# Patient Record
Sex: Female | Born: 1955
Health system: Southern US, Community
[De-identification: ages and names within clinical notes are randomized; demographics above are authoritative.]

## PROBLEM LIST (undated history)

## (undated) DIAGNOSIS — M1990 Unspecified osteoarthritis, unspecified site: Secondary | ICD-10-CM

## (undated) DIAGNOSIS — T7840XA Allergy, unspecified, initial encounter: Secondary | ICD-10-CM

## (undated) DIAGNOSIS — C801 Malignant (primary) neoplasm, unspecified: Secondary | ICD-10-CM

## (undated) DIAGNOSIS — E669 Obesity, unspecified: Secondary | ICD-10-CM

## (undated) DIAGNOSIS — I10 Essential (primary) hypertension: Secondary | ICD-10-CM

## (undated) HISTORY — DX: Essential (primary) hypertension: I10

## (undated) HISTORY — DX: Obesity, unspecified: E66.9

## (undated) HISTORY — PX: TOTAL ABDOMINAL HYSTERECTOMY W/ BILATERAL SALPINGOOPHORECTOMY: SHX83

## (undated) HISTORY — DX: Allergy, unspecified, initial encounter: T78.40XA

---

## 2005-05-10 ENCOUNTER — Encounter: Payer: Self-pay | Admitting: Family Medicine

## 2006-03-17 ENCOUNTER — Encounter: Payer: Self-pay | Admitting: Family Medicine

## 2006-03-17 ENCOUNTER — Ambulatory Visit: Payer: Self-pay | Admitting: Family Medicine

## 2006-03-17 DIAGNOSIS — I10 Essential (primary) hypertension: Secondary | ICD-10-CM | POA: Insufficient documentation

## 2006-03-17 DIAGNOSIS — R05 Cough: Secondary | ICD-10-CM

## 2006-03-17 DIAGNOSIS — R059 Cough, unspecified: Secondary | ICD-10-CM | POA: Insufficient documentation

## 2006-06-06 ENCOUNTER — Telehealth (INDEPENDENT_AMBULATORY_CARE_PROVIDER_SITE_OTHER): Payer: Self-pay | Admitting: *Deleted

## 2006-06-22 ENCOUNTER — Ambulatory Visit (HOSPITAL_COMMUNITY): Admission: RE | Admit: 2006-06-22 | Discharge: 2006-06-22 | Payer: Self-pay | Admitting: Family Medicine

## 2006-06-22 LAB — HM MAMMOGRAPHY

## 2006-06-24 ENCOUNTER — Encounter: Payer: Self-pay | Admitting: Family Medicine

## 2006-07-04 ENCOUNTER — Telehealth: Payer: Self-pay | Admitting: Family Medicine

## 2007-11-28 ENCOUNTER — Ambulatory Visit: Payer: Self-pay | Admitting: Family Medicine

## 2007-11-28 DIAGNOSIS — G2581 Restless legs syndrome: Secondary | ICD-10-CM | POA: Insufficient documentation

## 2007-11-28 LAB — CONVERTED CEMR LAB
BUN: 17 mg/dL (ref 6–23)
CO2: 26 meq/L (ref 19–32)
Chloride: 101 meq/L (ref 96–112)
Cholesterol: 183 mg/dL (ref 0–200)
Ferritin: 51 ng/mL (ref 10–291)
MCV: 84.4 fL (ref 78.0–100.0)
Platelets: 219 10*3/uL (ref 150–400)
Potassium: 3.7 meq/L (ref 3.5–5.3)
RBC: 4.95 M/uL (ref 3.87–5.11)
Sodium: 140 meq/L (ref 135–145)
Total Bilirubin: 0.6 mg/dL (ref 0.3–1.2)
Total Protein: 7.6 g/dL (ref 6.0–8.3)
Triglycerides: 129 mg/dL (ref <150)
VLDL: 26 mg/dL (ref 0–40)
WBC: 7.4 10*3/uL (ref 4.0–10.5)

## 2007-11-29 ENCOUNTER — Encounter: Payer: Self-pay | Admitting: Family Medicine

## 2007-12-13 ENCOUNTER — Encounter: Payer: Self-pay | Admitting: Family Medicine

## 2007-12-13 ENCOUNTER — Other Ambulatory Visit: Admission: RE | Admit: 2007-12-13 | Discharge: 2007-12-13 | Payer: Self-pay | Admitting: Family Medicine

## 2007-12-13 ENCOUNTER — Ambulatory Visit: Payer: Self-pay | Admitting: Family Medicine

## 2008-01-18 ENCOUNTER — Encounter: Admission: RE | Admit: 2008-01-18 | Discharge: 2008-01-18 | Payer: Self-pay | Admitting: Family Medicine

## 2008-01-18 ENCOUNTER — Ambulatory Visit: Payer: Self-pay | Admitting: Family Medicine

## 2008-01-18 DIAGNOSIS — M549 Dorsalgia, unspecified: Secondary | ICD-10-CM | POA: Insufficient documentation

## 2008-05-31 ENCOUNTER — Encounter: Admission: RE | Admit: 2008-05-31 | Discharge: 2008-05-31 | Payer: Self-pay | Admitting: Family Medicine

## 2008-05-31 ENCOUNTER — Ambulatory Visit: Payer: Self-pay | Admitting: Family Medicine

## 2008-05-31 DIAGNOSIS — M25569 Pain in unspecified knee: Secondary | ICD-10-CM | POA: Insufficient documentation

## 2008-10-23 ENCOUNTER — Telehealth: Payer: Self-pay | Admitting: Family Medicine

## 2008-10-28 ENCOUNTER — Telehealth: Payer: Self-pay | Admitting: Family Medicine

## 2009-12-15 ENCOUNTER — Ambulatory Visit: Payer: Self-pay | Admitting: Family Medicine

## 2009-12-15 DIAGNOSIS — D239 Other benign neoplasm of skin, unspecified: Secondary | ICD-10-CM | POA: Insufficient documentation

## 2009-12-16 LAB — CONVERTED CEMR LAB
BUN: 16 mg/dL (ref 6–23)
Cholesterol, target level: 200 mg/dL
Glucose, Bld: 90 mg/dL (ref 70–99)
HDL: 54 mg/dL (ref >39)
LDL Cholesterol: 110 mg/dL — ABNORMAL HIGH (ref 0–99)
LDL Goal: 160 mg/dL
Total Bilirubin: 0.9 mg/dL (ref 0.3–1.2)
Total CHOL/HDL Ratio: 3.5
VLDL: 27 mg/dL (ref 0–40)
Vit D, 25-Hydroxy: 38 ng/mL (ref 30–89)

## 2010-06-09 NOTE — Assessment & Plan Note (Signed)
Summary: HTN, lesion on arm   Vital Signs:  Patient profile:   55 year old female Height:      63 inches Weight:      203 pounds BMI:     36.09 Pulse rate:   69 / minute BP sitting:   117 / 75  (left arm) Cuff size:   large  Vitals Entered By: Kathlene November (December 15, 2009 8:42 AM) CC: follow-up BP   Primary Care Provider:  Nani Gasser MD  CC:  follow-up BP.  History of Present Illness: Has lost 24 lbs.   Current Medications (verified): 1)  Hyzaar 50-12.5 Mg Tabs (Losartan Potassium-Hctz) .... Take 1 Tablet By Mouth Once A Day  Allergies (verified): 1)  ! * (Ace Inhibitors)  Comments:  Nurse/Medical Assistant: The patient's medications and allergies were reviewed with the patient and were updated in the Medication and Allergy Lists. Kathlene November (December 15, 2009 8:43 AM)  Physical Exam  General:  Well-developed,well-nourished,in no acute distress; alert,appropriate and cooperative throughout examination Head:  Normocephalic and atraumatic without obvious abnormalities. No apparent alopecia or balding. Lungs:  Normal respiratory effort, chest expands symmetrically. Lungs are clear to auscultation, no crackles or wheezes. Heart:  Normal rate and regular rhythm. S1 and S2 normal without gallop, murmur, click, rub or other extra sounds. Skin:  Hyperpigmened ciruclar lesion on the right upper out arm with a partial halo effect.   Psych:  Cognition and judgment appear intact. Alert and cooperative with normal attention span and concentration. No apparent delusions, illusions, hallucinations   Impression & Recommendations:  Problem # 1:  HYPERTENSION, BENIGN ESSENTIAL (ICD-401.1) Congratulated her on her weight loss. doing well overall. Due for screening labs.  Exam is normal.  Her updated medication list for this problem includes:    Hyzaar 50-12.5 Mg Tabs (Losartan potassium-hctz) .[name]... Take 1 tablet by mouth once a day  Orders: T-Comprehensive Metabolic Panel  ([id number] T-Lipid Profile [phone]) T-TSH [phone]) T-Vitamin D (25-Hydroxy) [phone])  Problem # 2:  NEVUS, ATYPICAL (ICD-216.9) Had a question about a lesion on her right upper outer arm.  Slighly atypical because has a halo around part of it.  Discussed recommend schedule a bx for further dx.  Hopefully just a resolving nevus but some concerning for atypia.   Problem # 3:  SCREENING FOR MLIG NEOP, BREAST, NOS (ICD-V76.10) Due for mammogram.  Orders: T-Mammography Bilateral Screening ([id number])  Complete Medication List: 1)  Hyzaar 50-12.5 Mg Tabs (Losartan potassium-hctz) .... Take 1 tablet by mouth once a day  Other Orders: T-Comprehensive Metabolic Panel ([id number])  Patient Instructions: 1)  Please schedule a follow-up appointment in 6 months for blood pressure . 2)  If feels like your pressure is too low.    Flex Sig Next Due:  Not Indicated Hemoccult Next Due:  Not Indicated

## 2010-08-31 ENCOUNTER — Other Ambulatory Visit: Payer: Self-pay | Admitting: Family Medicine

## 2010-10-02 ENCOUNTER — Other Ambulatory Visit: Payer: Self-pay | Admitting: *Deleted

## 2010-10-02 MED ORDER — LOSARTAN POTASSIUM-HCTZ 50-12.5 MG PO TABS: 1.0000 | ORAL_TABLET | Freq: Every day | ORAL | Status: DC

## 2010-11-02 ENCOUNTER — Other Ambulatory Visit: Payer: Self-pay | Admitting: Family Medicine

## 2010-11-02 MED ORDER — LOSARTAN POTASSIUM-HCTZ 50-12.5 MG PO TABS: 1.0000 | ORAL_TABLET | Freq: Every day | ORAL | Status: DC

## 2010-11-21 ENCOUNTER — Encounter: Payer: Self-pay | Admitting: Family Medicine

## 2010-11-25 ENCOUNTER — Other Ambulatory Visit (HOSPITAL_COMMUNITY)
Admission: RE | Admit: 2010-11-25 | Discharge: 2010-11-25 | Disposition: A | Discharge status: Home / Self Care | Payer: BC Managed Care – PPO | Source: Ambulatory Visit | Attending: Family Medicine | Admitting: Family Medicine

## 2010-11-25 ENCOUNTER — Encounter: Payer: Self-pay | Admitting: Family Medicine

## 2010-11-25 ENCOUNTER — Ambulatory Visit (INDEPENDENT_AMBULATORY_CARE_PROVIDER_SITE_OTHER): Payer: BC Managed Care – PPO | Admitting: Family Medicine

## 2010-11-25 VITALS — BP 151/71 | HR 78 | Ht 63.0 in | Wt 218.0 lb

## 2010-11-25 DIAGNOSIS — Z1231 Encounter for screening mammogram for malignant neoplasm of breast: Secondary | ICD-10-CM

## 2010-11-25 DIAGNOSIS — Z1159 Encounter for screening for other viral diseases: Secondary | ICD-10-CM | POA: Insufficient documentation

## 2010-11-25 DIAGNOSIS — Z01419 Encounter for gynecological examination (general) (routine) without abnormal findings: Secondary | ICD-10-CM | POA: Insufficient documentation

## 2010-11-25 LAB — LIPID PANEL
Cholesterol: 199 mg/dL (ref 0–200)
HDL: 60 mg/dL (ref >39)
Total CHOL/HDL Ratio: 3.3 Ratio
Triglycerides: 118 mg/dL (ref <150)
VLDL: 24 mg/dL (ref 0–40)

## 2010-11-25 LAB — TSH: TSH: 4.494 u[IU]/mL (ref 0.350–4.500)

## 2010-11-25 MED ORDER — LOSARTAN POTASSIUM-HCTZ 100-12.5 MG PO TABS: 1.0000 | ORAL_TABLET | Freq: Every day | ORAL | Status: DC

## 2010-11-25 NOTE — Progress Notes (Signed)
  Subjective:     Emily Mcdonald is a 55 y.o. female and is here for a comprehensive physical exam. The patient reports no problems.Says she has gained about 15 lbs. Hasn't been working our recently.   History   Social History  . Marital Status: Married    Spouse Name: N/A    Number of Children: N/A  . 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 Smoking status: Never Smoker   . Smokeless tobacco: Not on file  . Alcohol Use: No  . Drug Use: No  . Sexually Active: Not Currently   Other Topics Concern  . Not on file   Social History Narrative   1-2 caffeine drinks   A day.  No regular exercise.     Health Maintenance  Topic Date Due  . Pap Smear  02/21/1973  . Mammogram  06/22/2008  . Influenza Vaccine  02/08/2011  . Tetanus/tdap  05/11/2015  . Colonoscopy  10/09/2015    The following portions of the patient's history were reviewed and updated as appropriate: allergies, current medications, past family history, past medical history, past social history and past surgical history.  Review of Systems A comprehensive review of systems was negative.   Objective:    BP 151/71  Pulse 78  Ht 5\' 3"  (1.6 m)  Wt 218 lb (98.884 kg)  BMI 38.62 kg/m2  SpO2 96%  LMP 10/16/2010 General appearance: alert, cooperative and appears stated age Head: Normocephalic, without obvious abnormality, atraumatic Eyes: conjunctiva clear, EOMi PEERLA Ears: normal TM's and external ear canals both ears Nose: Nares normal. Septum midline. Mucosa normal. No drainage or sinus tenderness. Throat: lips, mucosa, and tongue normal; teeth and gums normal Neck: no adenopathy, no carotid bruit, supple, symmetrical, trachea midline and thyroid not enlarged, symmetric, no tenderness/mass/nodules Back: symmetric, no curvature. ROM normal. No CVA tenderness. Lungs: clear to auscultation bilaterally Breasts: normal appearance, no masses  or tenderness Heart: regular rate and rhythm, S1, S2 normal, no murmur, click, rub or gallop Abdomen: soft, non-tender; bowel sounds normal; no masses,  no organomegaly Pelvic: cervix normal in appearance, external genitalia normal, no cervical motion tenderness, rectovaginal septum normal, uterus normal size, shape, and consistency and vagina normal without discharge Extremities: extremities normal, atraumatic, no cyanosis or edema Pulses: 2+ and symmetric Skin: Skin color, texture, turgor normal. No rashes or lesions Lymph nodes: Cervical, supraclavicular, and axillary nodes normal. Neurologic: Grossly normal    Assessment:    Healthy female exam.   Plan:     See After Visit Summary for Counseling Recommendations  Start a regular exercise program and make sure you are eating a healthy diet Try to eat 4 servings of dairy a day or take a calcium supplement (500mg  twice a day). Your vaccines are up to date.  Work on weight loss HTN- Not well controlled. Inc med and f/u in 4-6 weeks to recheck Given lab slip for screening labs.

## 2010-11-25 NOTE — Patient Instructions (Signed)
Start a regular exercise program and make sure you are eating a healthy diet Try to eat 4 servings of dairy a day or take a calcium supplement (500mg twice a day). Your vaccines are up to date.   

## 2010-11-25 NOTE — Progress Notes (Signed)
Addended by: Ellsworth Lennox on: 11/25/2010 09:04 AM   Modules accepted: Orders

## 2010-11-26 ENCOUNTER — Telehealth: Payer: Self-pay | Admitting: Family Medicine

## 2010-11-26 LAB — COMPLETE METABOLIC PANEL WITH GFR
ALT: 14 U/L (ref 0–35)
AST: 23 U/L (ref 0–37)
Albumin: 4.6 g/dL (ref 3.5–5.2)
BUN: 19 mg/dL (ref 6–23)
CO2: 27 mEq/L (ref 19–32)
Creat: 0.82 mg/dL (ref 0.50–1.10)
GFR, Est African American: >60 mL/min (ref >60)
GFR, Est Non African American: >60 mL/min (ref >60)
Glucose, Bld: 91 mg/dL (ref 70–99)
Sodium: 140 mEq/L (ref 135–145)
Total Bilirubin: 0.6 mg/dL (ref 0.3–1.2)

## 2010-11-26 NOTE — Telephone Encounter (Signed)
Advised pt of results and rec. 

## 2010-11-26 NOTE — Telephone Encounter (Signed)
Call patient: CMP is normal. Cholesterol slightly elevated. Make sure eating healthy low-fat diet and getting regular exercise. Recheck in one year. Thyroid is normal range.

## 2010-12-01 ENCOUNTER — Ambulatory Visit
Admission: RE | Admit: 2010-12-01 | Discharge: 2010-12-01 | Disposition: A | Discharge status: Home / Self Care | Payer: BC Managed Care – PPO | Source: Ambulatory Visit | Attending: Family Medicine | Admitting: Family Medicine

## 2010-12-01 ENCOUNTER — Ambulatory Visit: Payer: BC Managed Care – PPO

## 2010-12-01 DIAGNOSIS — Z1231 Encounter for screening mammogram for malignant neoplasm of breast: Secondary | ICD-10-CM

## 2010-12-03 ENCOUNTER — Telehealth: Payer: Self-pay | Admitting: Family Medicine

## 2010-12-03 NOTE — Telephone Encounter (Signed)
Please call patient. Normal mammogram.  Repeat in 1 year.  

## 2010-12-04 NOTE — Telephone Encounter (Signed)
Pt.notified

## 2010-12-07 ENCOUNTER — Telehealth: Payer: Self-pay | Admitting: Family Medicine

## 2010-12-07 NOTE — Telephone Encounter (Signed)
Left message on pt vm with resutls

## 2010-12-07 NOTE — Telephone Encounter (Signed)
Call pt: Pap is normal. Repeat in 2 years.  

## 2010-12-24 ENCOUNTER — Encounter: Payer: Self-pay | Admitting: Family Medicine

## 2010-12-24 ENCOUNTER — Ambulatory Visit (INDEPENDENT_AMBULATORY_CARE_PROVIDER_SITE_OTHER): Payer: BC Managed Care – PPO | Admitting: Family Medicine

## 2010-12-24 DIAGNOSIS — Z23 Encounter for immunization: Secondary | ICD-10-CM

## 2010-12-24 DIAGNOSIS — I10 Essential (primary) hypertension: Secondary | ICD-10-CM

## 2010-12-24 DIAGNOSIS — N898 Other specified noninflammatory disorders of vagina: Secondary | ICD-10-CM

## 2010-12-24 DIAGNOSIS — N924 Excessive bleeding in the premenopausal period: Secondary | ICD-10-CM

## 2010-12-24 DIAGNOSIS — N920 Excessive and frequent menstruation with regular cycle: Secondary | ICD-10-CM

## 2010-12-24 NOTE — Assessment & Plan Note (Signed)
Inc in med is working well. At goal. F/U in 6 months.

## 2010-12-24 NOTE — Patient Instructions (Signed)
We will call you with your lab results 

## 2010-12-24 NOTE — Progress Notes (Signed)
  Subjective:    Patient ID: Emily Mcdonald, female    DOB: [birth year], 55 y.o.   MRN: [id number]  Hypertension This is a chronic problem. The current episode started more than 1 year ago. The problem is controlled. Pertinent negatives include no blurred vision, chest pain, headaches, peripheral edema or shortness of breath. There are no associated agents to hypertension. Past treatments include angiotensin blockers and diuretics. The current treatment provides moderate improvement. There are no compliance problems.    She had stopped her period for almost  Year. Then had a really heavy periods. Then nothing for almost 6 months and now has been spotting again.    Review of Systems  Eyes: Negative for blurred vision.  Respiratory: Negative for shortness of breath.   Cardiovascular: Negative for chest pain.  Neurological: Negative for headaches.       Objective:   Physical Exam  Constitutional: She is oriented to person, place, and time. She appears well-developed and well-nourished.  HENT:  Head: Normocephalic and atraumatic.  Cardiovascular: Normal rate, regular rhythm and normal heart sounds.   Pulmonary/Chest: Effort normal and breath sounds normal.  Neurological: She is alert and oriented to person, place, and time.  Skin: Skin is warm and dry.  Psychiatric: She has a normal mood and affect. Her behavior is normal.          Assessment & Plan:  Perimenopausal bleeding - Will check hormone levels. If looks post menopausal then will need referrral for endometrial bx to rule out endometrial hyperplasia.  note she had a normal Pap smear in July and was negative for HPV.

## 2010-12-25 ENCOUNTER — Telehealth: Payer: Self-pay | Admitting: Family Medicine

## 2010-12-25 DIAGNOSIS — N950 Postmenopausal bleeding: Secondary | ICD-10-CM

## 2010-12-25 LAB — PROGESTERONE: Progesterone: <0.2 ng/mL

## 2010-12-25 NOTE — Telephone Encounter (Signed)
Call pt: By her labs she looks postmenopausal so I want her to see gyn to see if they think she might need an endometrial bx.

## 2010-12-25 NOTE — Telephone Encounter (Signed)
Pt notified of results via VM. Instructed pt we would call with referral date and time. KJ LPN

## 2011-02-02 ENCOUNTER — Ambulatory Visit (INDEPENDENT_AMBULATORY_CARE_PROVIDER_SITE_OTHER): Payer: BC Managed Care – PPO | Admitting: Obstetrics & Gynecology

## 2011-02-02 ENCOUNTER — Encounter: Payer: Self-pay | Admitting: Obstetrics & Gynecology

## 2011-02-02 VITALS — BP 128/80 | HR 83 | Temp 98.5°F | Resp 16 | Ht 63.0 in | Wt 222.0 lb

## 2011-02-02 DIAGNOSIS — N950 Postmenopausal bleeding: Secondary | ICD-10-CM | POA: Insufficient documentation

## 2011-02-02 NOTE — Patient Instructions (Signed)
Postmenopausal Bleeding Postmenopausal bleeding is when you have bleeding from the uterus 12 months after you stopped having menstrual periods. It could also be if you are still having menstrual periods and you are 55 years old or older. HOME CARE  Get yearly physical exams. This includes a pelvic exam and Pap test.   Call your doctor if you are in the menopause and have vaginal bleeding after sex.   Stop taking hormones. Talk to your doctor about this.  GET HELP IF:  You are passing clumps of blood (blood clots).   You have more bleeding than when you were having periods.   You are having a lot of pain with the bleeding.   You have a temperature by mouth above 102 F (38.9 C).   You start to have belly (abdominal) pain.   You pass out.  GET HELP RIGHT AWAY IF:  The bleeding lasts for more than one week.   You have bleeding and need more than one pad an hour.   You have signs of infection. These signs include fever, chills, headache, dizziness and muscle aches.   You have a temperature by mouth above 102 F (38.9 C), not controlled by medicine.  Easy-to-Read style based on content from [facility], [place], [place] Document Released: 02/03/2008 Document Re-Released: 04/14/2009 Exit[employer] Patient Information 2011 [employer]. 

## 2011-02-02 NOTE — Progress Notes (Signed)
  Subjective:    Patient ID: Charlott Rakes, female    DOB: 1955/06/30, 55 y.o.   MRN: [id number]  HPI Pt is a 55 yo G2P2 female with menopause starting approximately 3 years ago. Patient had gone in one year without a period. She had heavy bleeding in June. She has also had a . She has had FSH which showed her to be menopausal.  The patient denies any gyn problems--No std, fibroids, ovarian cysts, abnml paps.    Review of Systems  Constitutional: Negative.   Respiratory: Negative.   Cardiovascular: Negative.   Gastrointestinal: Negative.   Genitourinary: Negative.   Psychiatric/Behavioral: Negative.        Objective:   Physical Exam  Constitutional: She is oriented to person, place, and time. She appears well-developed and well-nourished. No distress.  HENT:  Head: Normocephalic and atraumatic.  Eyes: Conjunctivae are normal.  Pulmonary/Chest: Effort normal.  Abdominal: Soft. She exhibits no distension and no mass. There is no tenderness.  Genitourinary:       Atrophic vagina.  Nml cervix, uterus and adnexa were nontender but exam limited by body habitus.  Neurological: She is alert and oriented to person, place, and time.  Skin: Skin is warm and dry.  Psychiatric: She has a normal mood and affect.          Assessment & Plan:  Assessment. Patient is a 55 year old G2, P2, female with postmenopausal bleeding. Plan. 1. Exam normal. Bleeding bleeding to be coming from the uterus. Will order a transvaginal ultrasound to evaluate endometrial stripe. 2. Endometrial stripe is 4 mm biopsy is not warranted.If lLining is greater than 4, millimeters we'll proceed with endometrial biopsy. 3.  RTC 3 weeks.

## 2011-02-03 ENCOUNTER — Other Ambulatory Visit: Payer: Self-pay | Admitting: Obstetrics & Gynecology

## 2011-02-03 DIAGNOSIS — N950 Postmenopausal bleeding: Secondary | ICD-10-CM

## 2011-02-09 ENCOUNTER — Ambulatory Visit
Admission: RE | Admit: 2011-02-09 | Discharge: 2011-02-09 | Disposition: A | Discharge status: Home / Self Care | Payer: BC Managed Care – PPO | Source: Ambulatory Visit | Attending: Obstetrics & Gynecology | Admitting: Obstetrics & Gynecology

## 2011-02-09 DIAGNOSIS — N950 Postmenopausal bleeding: Secondary | ICD-10-CM

## 2011-02-22 ENCOUNTER — Other Ambulatory Visit: Payer: Self-pay | Admitting: Family Medicine

## 2011-03-02 ENCOUNTER — Encounter: Payer: Self-pay | Admitting: Obstetrics & Gynecology

## 2011-03-02 ENCOUNTER — Ambulatory Visit (INDEPENDENT_AMBULATORY_CARE_PROVIDER_SITE_OTHER): Payer: BC Managed Care – PPO | Admitting: Obstetrics & Gynecology

## 2011-03-02 VITALS — BP 127/75 | HR 81 | Temp 98.5°F | Resp 16 | Ht 63.0 in | Wt 223.0 lb

## 2011-03-02 DIAGNOSIS — N950 Postmenopausal bleeding: Secondary | ICD-10-CM | POA: Insufficient documentation

## 2011-03-02 NOTE — Progress Notes (Signed)
  Subjective:    Patient ID: Emily Mcdonald, female    DOB: [birth year], 55 y.o.   MRN: [id number]  HPI  Pt presents for follow up.  Pt having postmenopausal bleeding.  Still bleeding now.  US shows 16 mm lining and questionable polyp.  Pt needs Endometrial Bx and wants to proceed today  Review of Systems  Constitutional: Negative.   Respiratory: Negative.   Cardiovascular: Negative.   Gastrointestinal: Negative.   Genitourinary: Positive for vaginal bleeding.  Musculoskeletal: Negative.   Psychiatric/Behavioral: Negative.        Objective:   Physical Exam  Constitutional: She is oriented to person, place, and time. She appears well-developed and well-nourished. No distress.  HENT:  Head: Normocephalic and atraumatic.  Eyes: Conjunctivae are normal.  Pulmonary/Chest: Effort normal.  Abdominal: Soft.  Genitourinary: Vagina normal and uterus normal.  Neurological: She is alert and oriented to person, place, and time.  Skin: Skin is warm and dry.  Psychiatric: She has a normal mood and affect.          Assessment & Plan:  Patient given informed consent, signed copy in the chart, time out was performed. Appropriate time out taken. . The patient was placed in the lithotomy position and the cervix brought into view with sterile speculum.  Portio of cervix cleansed x 2 with betadine swabs.  A tenaculum was placed in the anterior lip of the cervix.  The uterus was sounded for depth of 7cm. A pipelle was introduced to into the uterus, suction created,  and an endometrial sample was obtained. All equipment was removed and accounted for.  The patient tolerated the procedure well.    Patient given post procedure instructions. The patient will return in 2 weeks for results.

## 2011-03-02 NOTE — Patient Instructions (Signed)
Endometrial Biopsy This is a test in which a tissue sample (a biopsy) is taken from inside the uterus (womb). It is then looked at by a specialist under a microscope to see if the tissue is normal or abnormal. The endometrium is the lining of the uterus. This test helps determine where you are in your menstrual cycle and how hormone levels are affecting the lining of the uterus. Another use for this test is to diagnose endometrial cancer, tuberculosis, polyps, or inflammatory conditions and to evaluate uterine bleeding. PREPARATION FOR TEST No preparation or fasting is necessary. NORMAL FINDINGS No pathologic conditions. Presence of "secretory-type" endometrium 3 to 5 days before to normal menstruation. Ranges for normal findings may vary among different laboratories and hospitals. You should always check with your doctor after having lab work or other tests done to discuss the meaning of your test results and whether your values are considered within normal limits. MEANING OF TEST  Your caregiver will go over the test results with you and discuss the importance and meaning of your results, as well as treatment options and the need for additional tests if necessary. OBTAINING THE TEST RESULTS It is your responsibility to obtain your test results. Ask the lab or department performing the test when and how you will get your results. Document Released: 08/27/2004 Document Revised: 01/06/2011 Document Reviewed: 04/05/2008 [employer] Patient Information 2012 [employer]. 

## 2011-03-05 ENCOUNTER — Telehealth: Payer: Self-pay | Admitting: *Deleted

## 2011-03-05 NOTE — Telephone Encounter (Signed)
Appt made with Dr Penne Lash  03/10/11 to discuss test result.  Left message with Dr. Denman George office to call us for an appt for pt.

## 2011-03-09 ENCOUNTER — Telehealth: Payer: Self-pay | Admitting: *Deleted

## 2011-03-09 DIAGNOSIS — C801 Malignant (primary) neoplasm, unspecified: Secondary | ICD-10-CM

## 2011-03-09 NOTE — Telephone Encounter (Signed)
Appt made with GYN/ONC Dr Loree Fee 03/19/11 @ 1:30.

## 2011-03-10 ENCOUNTER — Encounter: Payer: Self-pay | Admitting: Obstetrics & Gynecology

## 2011-03-10 ENCOUNTER — Ambulatory Visit (INDEPENDENT_AMBULATORY_CARE_PROVIDER_SITE_OTHER): Payer: BC Managed Care – PPO | Admitting: Obstetrics & Gynecology

## 2011-03-10 VITALS — BP 128/73 | HR 82 | Temp 98.5°F | Resp 16 | Ht 63.0 in | Wt 222.0 lb

## 2011-03-10 DIAGNOSIS — C549 Malignant neoplasm of corpus uteri, unspecified: Secondary | ICD-10-CM

## 2011-03-10 DIAGNOSIS — C541 Malignant neoplasm of endometrium: Secondary | ICD-10-CM

## 2011-03-10 NOTE — Patient Instructions (Signed)
Cancer of the Uterus The uterus is part of a woman's reproductive system. It is the hollow, pear-shaped organ where a baby grows. The uterus is in the pelvis between the bladder and the rectum. The narrow, lower portion of the uterus is the cervix. The fallopian tubes extend from either side of the top of the uterus to the ovaries. The wall of the uterus has two layers of tissue. The inner layer, or lining, is the endometrium. The outer layer is muscle tissue called the myometrium. In women of childbearing age, the lining of the uterus grows and thickens each month to prepare for pregnancy. If a woman does not become pregnant, the thick, bloody lining flows out of the body through the vagina. This flow is called menstruation. TYPES OF UTERINE CANCER  The most common type of cancer of the uterus begins in the lining (endometrium). It is called endometrial cancer, uterine cancer, or cancer of the uterus. It is seen in 2% to 3% of women.     A different type of cancer, uterine sarcoma, develops in the muscle (myometrium). Cancer that begins in the cervix is also a different type of cancer.     Rarely, a noncancerous fibroid tumor of the uterus develops into a sarcoma.  CAUSES   No one knows the exact causes of uterine cancer. But it is clear that this disease is not contagious. No one can "catch" cancer from another person. Women who get this disease are more likely than other women to have certain risk factors. A risk factor is something that increases a person's chance of developing the disease.  Most women who have known risk factors do not get uterine cancer. On the other hand, many who do get this disease have none of these factors. Doctors can seldom explain why one woman gets uterine cancer and another does not.     Studies have found the following risk factors:     Age. Cancer of the uterus occurs mostly in women over age 50.     Endometrial hyperplasia (enlarged endometrium). The risk of  uterine cancer is higher if a woman has endometrial hyperplasia.     Hormone replacement therapy (HRT). HRT is used to control the symptoms of menopause, to prevent osteoporosis (thinning of the bones), and to reduce the risk of heart disease or stroke. Women who still have their uterus, and use estrogen without progesterone, have an increased risk of uterine cancer. Long-term use and large doses of estrogen seem to increase this risk. Women who use a combination of estrogen and progesterone have a lower risk of uterine cancer than women who use estrogen alone. The progesterone protects the uterus from developing cancer.     Obesity and related conditions. The body stores and releases some of its estrogen in fatty tissue. That is why obese women are more likely than thin women to have higher levels of estrogen in their bodies. High levels of estrogen may be the reason that obese women have an increased risk of developing uterine cancer. The risk of this disease is also higher in women with diabetes or high blood pressure. These conditions occur in many obese women.     Tamoxifen. Women taking the drug tamoxifen to prevent or treat breast cancer have an increased risk of uterine cancer. This risk appears to be related to the estrogen-like effect of this drug on the uterus.     Race. White women are more likely than African-American women to get uterine cancer.       Colorectal cancer. Women who have had an inherited form of colorectal cancer have a higher risk of developing uterine cancer than other women.     Infertility.    Beginning menstrual periods before age 12.     Having menstrual periods after age 52.     History of cancer of the ovary or intestine.     Family history of uterine cancer.     Having diabetes, high blood pressure, thyroid or gallbladder disease.     Long-term use of high does of birth control pills. Birth control pills today are low in hormone doses.     Radiation to the  abdomen or pelvis.     Smoking.  SYMPTOMS  Uterine cancer usually occurs after menopause. But it may also occur around the time that menopause begins. Abnormal vaginal bleeding is the most common symptom of uterine cancer. Bleeding may start as a watery, blood-streaked flow that gradually contains more blood. Women should not assume that abnormal vaginal bleeding is part of menopause. A woman should see her caregiver if she has any of the following symptoms:  Unusual vaginal bleeding or discharge.     Difficult or painful urination.     Pain during intercourse.     Pain in the pelvic area.     Increased girth (growth) of the stomach.     Any vaginal bleeding after menopause.     Unexplained weight loss.  These symptoms can be caused by cancer or other less serious conditions. Most often they are not cancer. But a thorough evaluation is needed to be certain. DIAGNOSIS   If a woman has symptoms that suggest uterine cancer, her caregiver may check her general health and may order blood and urine tests. The caregiver also may perform one or more of these exams or tests.  Blood and urine tests and chest x-rays. The woman also may have:     Other X-rays.     CT scans.     Ultrasound test.     Magnetic resonance imaging (MRI).     Sigmoidoscopy.    Colonoscopy.    Pelvic exam. A woman will have a pelvic exam to check the vagina, uterus, bladder, and rectum. The caregiver feels these organs for any lumps or changes in their shape or size. To see the upper part of the vagina and the cervix, the caregiver inserts an instrument called a speculum into the vagina.     Pap test. The caregiver collects cells from the cervix and upper vagina. A medical laboratory checks for abnormal cells. The Pap test is better for detecting cancer of the cervix. But cells from inside the uterus usually do not show up on a Pap test. It is not a reliable test for uterine cancer.     Transvaginal ultrasound.  The medical caregiver inserts an instrument into the vagina. The instrument aims high-frequency sound waves at the uterus. The pattern of the echoes they produce creates a picture. If the endometrium looks too thick, the caregiver can do a biopsy.     Biopsy. The medical caregiver removes a sample of tissue from the uterine lining. This usually can be done in the caregiver's office.     Dilatation and Curettage (D&C). In some cases, a woman may need to have a D&C. D&C is usually done as same-day surgery with anesthesia in a hospital. A pathologist examines the tissue (lining of the uterus) to check for cancer cells and other conditions.  STAGING      If uterine cancer is diagnosed, the caregiver needs to know the stage, or extent, of the disease to plan the best treatment. Staging is a careful attempt to find out whether the cancer has spread, and if so, to what parts of the body.     When uterine cancer spreads (metastasizes) outside the uterus, cancer cells are often found in nearby lymph nodes, nerves, or blood vessels. If the cancer has reached the lymph nodes, cancer cells may have spread to other lymph nodes and other organs of the body.     Staging is done at the time of surgery. In most cases, the most reliable way to stage this disease is to remove the uterus, cervix, tubes, ovaries, and lymph nodes. A pathologist uses a microscope to examine the uterus and other tissues removed by the surgeon, to determine the extent of the cancer in the pelvis.     If lymph nodes have cancer cells, other parts of the body are examined, to see if it has spread to other organs.  MAIN FEATURES OF EACH STAGE OF THE DISEASE: Stage I. The cancer is only in the body of the uterus. It is not in the cervix. Stage II. The cancer has spread from the body of the uterus to the cervix. Stage III. The cancer has spread outside the uterus, but not outside the pelvis (and not to the bladder or rectum). Lymph nodes in the  pelvis may contain cancer cells. Stage IV. The cancer has spread into the bladder or rectum. It may have spread beyond the pelvis to other body parts. TREATMENT   Women with uterine cancer have many treatment options. Most women with uterine cancer are treated with surgery. Some have radiation or chemotherapy. A smaller number of women may be treated with hormonal therapy. Some patients receive a combination of therapies. You may want to consult with another cancer doctor for a second opinion. The caregiver (usually a cancer doctor) is the best person to describe your treatment choices and to discuss the expected results of treatment. SURGERY  Most women with uterine cancer have surgery to remove the uterus, cervix, tubes, and ovaries (total hysterectomy). This is usually done through an incision in the abdomen.     The doctor may also remove the lymph nodes near the tumor, to see if they contain cancer. If cancer cells have reached the lymph nodes, it may mean that the disease has spread to other parts of the body. If cancer cells have not spread beyond the endometrium, the woman may not need to have any other treatment. The length of the hospital stay may vary from several days to a week.  RADIATION THERAPY  In radiation therapy, high-energy rays are used to kill cancer cells. Like surgery, radiation therapy is a local therapy. It affects cancer cells only in the treated area.     Some women with Stage I, II, or III uterine cancer need both radiation therapy and surgery. They may have radiation before surgery to shrink the tumor, or after surgery to destroy any cancer cells that remain in the area. The doctor may suggest radiation treatments for the small number of women who cannot have surgery.     Doctors use two types of radiation therapy to treat uterine cancer:     External radiation. In external radiation therapy, a large machine outside the body is used to aim radiation at the tumor area.  The woman usually does not stay overnight (outpatient) at the hospital   or clinic, and receives external radiation 5 days a week for several weeks. This schedule helps protect healthy cells and tissue by spreading out the total dose of radiation. No radioactive materials are put into the body for external radiation therapy.     Internal radiation. In internal radiation therapy, tiny tubes containing a radioactive substance are inserted through the vagina and cervix, into the uterus, and left in place for a few days. The woman stays in the hospital during this treatment. To protect others from radiation exposure, the patient may not be able to have visitors or may have visitors only for a short period of time while the implant is in place. Once the implant is removed, the woman has no radioactivity in her body.     Some patients need both external and internal radiation therapies.  CHEMOTHERAPY Chemotherapy is not usually used for endometrial cancer of the uterus. However, with sarcoma of the uterus or of the fibroid, it may be used in combination with surgery. Chemotherapy may also be used with recurring sarcoma, and in patients who cannot have surgery. HORMONE THERAPY Hormonal therapy involves substances that prevent cancer cells from multiplying or growing by attaching to hormone receptors. This causes changes in cancer cells. Before therapy begins, the caregiver may request a hormone receptor test. This special lab test of uterine tissue helps the caregiver learn if estrogen and progesterone receptors are present. If the tissue has receptors, the woman is more likely to respond to hormonal therapy.  Hormonal therapy is called a systemic therapy, because it can affect cancer cells throughout the body. Usually, hormonal therapy is a type of progesterone, taken as a pill or injection.     The doctor may use hormonal therapy for women with uterine cancer who are unable to have surgery or radiation therapy.  Also, the doctor may give hormonal therapy to women with uterine cancer that has spread to the lungs or other distant sites. It is also given to women with uterine cancer that has come back.     Hormonal therapy can cause a number of side effects. Women taking progesterone may retain fluid, have an increased appetite, and gain weight. Women who are still menstruating may have changes in their periods.     Hormone therapy can be used in combination with surgery or radiation.  HOME CARE INSTRUCTIONS    Maintain a normal weight with a healthy balanced diet and exercise.     If you have diabetes, high blood pressure, thyroid or gallbladder disease, keep them in control with your caregiver's treatment and recommendations.     Do not smoke.     Do not take estrogen without taking progesterone with it, for menopausal symptoms.     Join a support group or get counseling, if you would like help dealing with your cancer.     If you are on hormone replacement therapy, see your caregiver as recommended, and be informed about the side effects of HRT.     Women with known risk factors should ask their caregiver what symptoms to look for and how often they should have an examination.     Keep your follow-up appointments and take your medicines as advised.     Write your questions down, and take them with you to your caregiver's appointments.     You may want another person to be with you for your appointments, so you do not miss any instructions.  SEEK MEDICAL CARE IF:    You   have any abnormal vaginal bleeding.     You are having menstrual periods at the age of 52 or older.     You have bleeding after sexual intercourse.     You are taking tomoxifen and develop vaginal bleeding.     Your stomach is growing, and you are not pregnant.     You have pain with sexual intercourse.     You have stomach or pelvis pain.     You have weight loss for no known reason.     You have pain or difficulty  with urination.  [facility] BOOKLETS   Cancer Information Service (CIS) provides accurate, up-to-date information on cancer to patients and their families, health professionals, and the general public:  Phone: 1-800-4-CANCER (1-800-422-6237).     Internet: http://www.cancer.gov  NCI's website contains complete information about cancer causes and prevention, screening and diagnosis, treatment and survivorship, clinical trials, statistics, funding, training, and employment opportunities, and the Institute and its programs. CLINICAL TRIALS A woman who is interested in being part of a clinical trial should talk with her caregiver. NCI's website (http://www.cancer.gov) provides general information about clinical trials. It also offers detailed information about specific ongoing studies of uterine cancer by linking to PDQ, a cancer information database developed by the NCI. The Cancer Information Service at 1-800-4-CANCER can answer questions about cancer and provide information from the PDQ database. Document Released: 04/26/2005 Document Revised: 01/06/2011 Document Reviewed: 02/27/2009 ExitCare Patient Information 2012 [employer]. 

## 2011-03-10 NOTE — Progress Notes (Signed)
  Subjective:    Patient ID: Emily Mcdonald, female    DOB: [birth year], 55 y.o.   MRN: [id number]  HPI  Pt informed of diagnosis of endometrial cancer.  Path report ENDOMETRIOID ADENOCARCINOMA WITH SQUAMOUS DIFFERENTIATION, FIGO GRADE I, ARISING IN A BACKGROUND OF ATYPICAL COMPLEX HYPERPLASIA.  Pt has appt with Gyn Onc at [facility] on 03/19/11. Pt would like me present at the surgery if possible.  Will let Gyn Onc team know.  Review of Systems    No complaints  Objective:   Physical Exam  Pt aware and comprehends her diagnosis.  Appropriate affect.       Assessment & Plan:  55 yo female with endometrial cancer  Refer to gyn onc as above.

## 2011-03-17 ENCOUNTER — Ambulatory Visit: Payer: BC Managed Care – PPO | Admitting: Obstetrics & Gynecology

## 2011-03-19 ENCOUNTER — Encounter: Payer: Self-pay | Admitting: Gynecology

## 2011-03-19 ENCOUNTER — Ambulatory Visit: Payer: BC Managed Care – PPO | Attending: Gynecology | Admitting: Gynecology

## 2011-03-19 VITALS — BP 110/70 | HR 64 | Temp 98.0°F | Resp 16 | Ht 63.27 in | Wt 221.8 lb

## 2011-03-19 DIAGNOSIS — C549 Malignant neoplasm of corpus uteri, unspecified: Secondary | ICD-10-CM | POA: Insufficient documentation

## 2011-03-19 DIAGNOSIS — Z803 Family history of malignant neoplasm of breast: Secondary | ICD-10-CM | POA: Insufficient documentation

## 2011-03-19 DIAGNOSIS — C541 Malignant neoplasm of endometrium: Secondary | ICD-10-CM

## 2011-03-19 DIAGNOSIS — I10 Essential (primary) hypertension: Secondary | ICD-10-CM | POA: Insufficient documentation

## 2011-03-19 DIAGNOSIS — Z79899 Other long term (current) drug therapy: Secondary | ICD-10-CM | POA: Insufficient documentation

## 2011-03-19 DIAGNOSIS — E669 Obesity, unspecified: Secondary | ICD-10-CM | POA: Insufficient documentation

## 2011-03-19 NOTE — Progress Notes (Signed)
Consult Note: Gyn-Onc  Charlott Rakes 55 y.o. female  CC:  Chief Complaint  Patient presents with  . Cancer    Endometrial, new pt    HPI:  Interval History:   Review of Systems:  Current Meds:  Outpatient Encounter Prescriptions as of 03/19/2011  Medication Sig Dispense Refill  . Ascorbic Acid (VITAMIN C) 1000 MG tablet Take 1,000 mg by mouth daily.        . calcium carbonate 200 MG capsule Take 250 mg by mouth 2 (two) times daily with a meal.        . Cyanocobalamin (VITAMIN B-12 CR PO) Take by mouth daily.       . fish oil-omega-3 fatty acids 1000 MG capsule Take 2 g by mouth daily.        [name] losartan-hydrochlorothiazide (HYZAAR) 100-12.5 MG per tablet TAKE ONE TABLET BY MOUTH EVERY DAY  30 tablet  2  . Omega-3 Fatty Acids (FISH OIL PO) Take by mouth.          Allergy:  Allergies  Allergen Reactions  . Ace Inhibitors     REACTION: cough    Social Hx:   History   Social History  . Marital Status: Married    Spouse Name: N/A    Number of Children: N/A  . 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 Smoking status: Never Smoker   . Smokeless tobacco: Never Used  . Alcohol Use: No  . Drug Use: No  . Sexually Active: Not Currently   Other Topics Concern  . Not on file   Social History Narrative   1-2 caffeine drinks   A day.  No regular exercise.      Past Surgical Hx: History reviewed. No pertinent past surgical history.  Past Medical Hx:  Past Medical History  Diagnosis Date  . Hypertension   . Obesity     Family Hx:  Family History  Problem Relation Age of Onset  . Breast cancer Maternal Grandmother   . Lymphoma Paternal Grandmother 65  . Heart attack Mother 6  . Diabetes Mother     late 21s.   . Diabetes Father   . Diabetes Brother   . Hypertension Brother   . Diabetes Brother   . Hypertension Brother   . Pulmonary fibrosis Mother     passed away from this     Vitals:  Blood pressure 110/70, pulse 64, temperature 98 F (36.7 C), temperature source Oral, resp. rate 16, height 5' 3.27" (1.607 m), weight 221 lb 12.8 oz (100.608 kg), last menstrual period 10/16/2010.  Physical Exam:  Assessment/Plan:   Jeannette Corpus, MD 03/19/2011, 2:40 PM

## 2011-03-19 NOTE — Progress Notes (Addendum)
Consult Note: Gyn-Onc  Emily Mcdonald 55 y.o. female  CC:  Chief Complaint  Patient presents with  . Cancer    Endometrial, new pt    HPI: Patient seen in consultation at the request of Dr. Elsie Lincoln regarding management of a newly diagnosed endometrial cancer. She presented with postmenopausal bleeding and ultimately underwent an endometrial biopsy which revealed endometrioid adenocarcinoma with squamous differentiation (grade 1) MRI of the pelvis is then obtained as well showing a normal uterus with a thickened endometrial stripe. There's no adnexal masses or free fluid.  Patient is not having any pelvic pain and denies any GI or GU symptoms. Her functional status is excellent.     Review of Systems: 10 point review of systems is negative except as noted above  Current Meds:  Outpatient Encounter Prescriptions as of 03/19/2011  Medication Sig Dispense Refill  . Ascorbic Acid (VITAMIN C) 1000 MG tablet Take 1,000 mg by mouth daily.        . calcium carbonate 200 MG capsule Take 250 mg by mouth 2 (two) times daily with a meal.        . Cyanocobalamin (VITAMIN B-12 CR PO) Take by mouth daily.       . fish oil-omega-3 fatty acids 1000 MG capsule Take 2 g by mouth daily.        [name] losartan-hydrochlorothiazide (HYZAAR) 100-12.5 MG per tablet TAKE ONE TABLET BY MOUTH EVERY DAY  30 tablet  2  . Omega-3 Fatty Acids (FISH OIL PO) Take by mouth.          Allergy:  Allergies  Allergen Reactions  . Ace Inhibitors     REACTION: cough    Social Hx:   History   Social History  . Marital Status: Married    Spouse Name: N/A    Number of Children: N/A  . 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 Smoking status: Never Smoker   . Smokeless tobacco: Never Used  . Alcohol Use: No  . Drug Use: No  . Sexually Active: Not Currently   Other Topics Concern  . Not on file   Social History Narrative   1-2  caffeine drinks   A day.  No regular exercise.      Past Surgical Hx: History reviewed. No pertinent past surgical history.  Past Medical Hx:  Past Medical History  Diagnosis Date  . Hypertension   . Obesity     Family Hx:  Family History  Problem Relation Age of Onset  . Breast cancer Maternal Grandmother   . Lymphoma Paternal Grandmother 29  . Heart attack Mother 95  . Diabetes Mother     late 37s.   . Diabetes Father   . Diabetes Brother   . Hypertension Brother   . Diabetes Brother   . Hypertension Brother   . Pulmonary fibrosis Mother     passed away from this    Vitals:  Blood pressure 110/70, pulse 64, temperature 98 F (36.7 C), temperature source Oral, resp. rate 16, height 5' 3.27" (1.607 m), weight 221 lb 12.8 oz (100.608 kg), last menstrual period 10/16/2010.  Physical Exam: The patient is a well-developed overweight white female in no acute distress  HEENT is negative  neck is supple thyromegaly  There is no supraclavicular or inguinal adenopathy.  The abdomen is obese soft nontender no masses, organomegaly, ascites or hernias  are noted.  Pelvic exam  EGBUS vagina bladder urethra are normal  The cervix appears normal  there is blood at the cervical os. Uterus is anterior normal shape size and consistency.  There's no adnexal masses  rectovaginal exam confirms  lower extremities are without edema or varicosities  Assessment/Plan: Grade 1 endometrial adenocarcinoma.  I had a discussion with the patient and her husband and 2 daughters regarding endometrial cancer. I recommend she undergo a hysterectomy with bilateral salpingo-oophorectomy. Intraoperative frozen section to decide as to whether pelvic and para-aortic lymphadenectomy should be performed. Pros and cons of open versus robotic of hysterectomy were discussed and the patient would prefer to have robotic hysterectomy. She will undergo preoperative testing and we will schedule a surgery in the near  future.  The risks of surgery including hemorrhage infection injury to adjacent viscera thromboembolic complications and anesthetic risks were all discussed. All questions are answered.   Jeannette Corpus, MD 03/19/2011, 4:59 PM

## 2011-03-19 NOTE — Patient Instructions (Signed)
Surgery to be scheduled in the near future

## 2011-03-29 ENCOUNTER — Encounter (HOSPITAL_COMMUNITY): Payer: Self-pay | Admitting: Pharmacy Technician

## 2011-03-29 ENCOUNTER — Other Ambulatory Visit: Payer: Self-pay | Admitting: Gynecologic Oncology

## 2011-03-31 ENCOUNTER — Other Ambulatory Visit: Payer: Self-pay

## 2011-03-31 ENCOUNTER — Ambulatory Visit (HOSPITAL_COMMUNITY)
Admission: RE | Admit: 2011-03-31 | Discharge: 2011-03-31 | Disposition: A | Discharge status: Home / Self Care | Payer: BC Managed Care – PPO | Source: Ambulatory Visit | Attending: Gynecologic Oncology | Admitting: Gynecologic Oncology

## 2011-03-31 ENCOUNTER — Encounter (HOSPITAL_COMMUNITY): Payer: Self-pay

## 2011-03-31 ENCOUNTER — Encounter (HOSPITAL_COMMUNITY)
Admission: RE | Admit: 2011-03-31 | Discharge: 2011-03-31 | Disposition: A | Discharge status: Home / Self Care | Payer: BC Managed Care – PPO | Source: Ambulatory Visit | Attending: Obstetrics & Gynecology | Admitting: Obstetrics & Gynecology

## 2011-03-31 DIAGNOSIS — Z01812 Encounter for preprocedural laboratory examination: Secondary | ICD-10-CM | POA: Insufficient documentation

## 2011-03-31 DIAGNOSIS — I10 Essential (primary) hypertension: Secondary | ICD-10-CM

## 2011-03-31 DIAGNOSIS — Z01811 Encounter for preprocedural respiratory examination: Secondary | ICD-10-CM | POA: Insufficient documentation

## 2011-03-31 DIAGNOSIS — C801 Malignant (primary) neoplasm, unspecified: Secondary | ICD-10-CM

## 2011-03-31 DIAGNOSIS — M1990 Unspecified osteoarthritis, unspecified site: Secondary | ICD-10-CM

## 2011-03-31 DIAGNOSIS — C549 Malignant neoplasm of corpus uteri, unspecified: Secondary | ICD-10-CM | POA: Insufficient documentation

## 2011-03-31 HISTORY — DX: Malignant (primary) neoplasm, unspecified: C80.1

## 2011-03-31 HISTORY — DX: Unspecified osteoarthritis, unspecified site: M19.90

## 2011-03-31 HISTORY — DX: Essential (primary) hypertension: I10

## 2011-03-31 LAB — CBC
HCT: 39.9 % (ref 36.0–46.0)
MCV: 83.8 fL (ref 78.0–100.0)
Platelets: 214 10*3/uL (ref 150–400)
RBC: 4.76 MIL/uL (ref 3.87–5.11)
WBC: 9.3 10*3/uL (ref 4.0–10.5)

## 2011-03-31 LAB — COMPREHENSIVE METABOLIC PANEL
AST: 21 U/L (ref 0–37)
BUN: 14 mg/dL (ref 6–23)
CO2: 28 mEq/L (ref 19–32)
Chloride: 101 mEq/L (ref 96–112)
Creatinine, Ser: 0.79 mg/dL (ref 0.50–1.10)
GFR calc non Af Amer: >90 mL/min (ref >90)
Total Bilirubin: 0.4 mg/dL (ref 0.3–1.2)

## 2011-03-31 LAB — DIFFERENTIAL
Basophils Absolute: 0 10*3/uL (ref 0.0–0.1)
Basophils Relative: 0 % (ref 0–1)
Monocytes Absolute: 0.6 10*3/uL (ref 0.1–1.0)
Neutro Abs: 6.6 10*3/uL (ref 1.7–7.7)

## 2011-03-31 NOTE — Patient Instructions (Signed)
20 Emily Mcdonald  03/31/2011   Your procedure is scheduled on: 04-06-11  Report to [facility] at  05:15  AM.  Call this number if you have problems the morning of surgery: [phone]   Remember:   Do not eat food:After Midnight.  Do not drink clear liquids: After Midnight.  Take these medicines the morning of surgery with A SIP OF WATER:no meds   Do not wear jewelry, make-up or nail polish.  Do not wear lotions, powders, or perfumes. You may wear deodorant.  Do not shave 48 hours prior to surgery.  Do not bring valuables to the hospital.  Contacts, dentures or bridgework may not be worn into surgery.  Leave suitcase in the car. After surgery it may be brought to your room.  For patients admitted to the hospital, checkout time is 11:00 AM the day of discharge.   Patients discharged the day of surgery will not be allowed to drive home.  Name and phone number of your driver:  family  Special Instructions: CHG Shower Use Special Wash: 1/2 bottle night before surgery and 1/2 bottle morning of surgery.   Please read over the following fact sheets that you were given: Blood Transfusion Information and MRSA Information

## 2011-03-31 NOTE — Pre-Procedure Instructions (Signed)
03-31-11 EKG/ CXR done today.

## 2011-04-06 ENCOUNTER — Encounter (HOSPITAL_COMMUNITY): Payer: Self-pay | Admitting: Certified Registered Nurse Anesthetist

## 2011-04-06 ENCOUNTER — Other Ambulatory Visit: Payer: Self-pay | Admitting: Gynecologic Oncology

## 2011-04-06 ENCOUNTER — Ambulatory Visit (HOSPITAL_COMMUNITY)
Admission: RE | Admit: 2011-04-06 | Discharge: 2011-04-07 | Disposition: A | Discharge status: Home / Self Care | Payer: BC Managed Care – PPO | Source: Ambulatory Visit | Attending: Obstetrics & Gynecology | Admitting: Obstetrics & Gynecology

## 2011-04-06 ENCOUNTER — Encounter (HOSPITAL_COMMUNITY): Payer: Self-pay | Admitting: Gynecologic Oncology

## 2011-04-06 ENCOUNTER — Encounter (HOSPITAL_COMMUNITY)
Admission: RE | Disposition: A | Discharge status: Home / Self Care | Payer: Self-pay | Source: Ambulatory Visit | Attending: Obstetrics & Gynecology

## 2011-04-06 ENCOUNTER — Encounter (HOSPITAL_COMMUNITY): Payer: Self-pay

## 2011-04-06 ENCOUNTER — Ambulatory Visit (HOSPITAL_COMMUNITY): Payer: BC Managed Care – PPO | Admitting: Certified Registered Nurse Anesthetist

## 2011-04-06 DIAGNOSIS — Z79899 Other long term (current) drug therapy: Secondary | ICD-10-CM | POA: Insufficient documentation

## 2011-04-06 DIAGNOSIS — C549 Malignant neoplasm of corpus uteri, unspecified: Secondary | ICD-10-CM | POA: Insufficient documentation

## 2011-04-06 DIAGNOSIS — E669 Obesity, unspecified: Secondary | ICD-10-CM | POA: Insufficient documentation

## 2011-04-06 DIAGNOSIS — C541 Malignant neoplasm of endometrium: Secondary | ICD-10-CM

## 2011-04-06 DIAGNOSIS — I10 Essential (primary) hypertension: Secondary | ICD-10-CM | POA: Insufficient documentation

## 2011-04-06 HISTORY — PX: ABDOMINAL HYSTERECTOMY: SHX81

## 2011-04-06 LAB — SAMPLE TO BLOOD BANK

## 2011-04-06 SURGERY — ROBOTIC ASSISTED TOTAL HYSTERECTOMY WITH BILATERAL SALPINGO OOPHORECTOMY
Anesthesia: General | Wound class: Clean Contaminated

## 2011-04-06 MED ORDER — SUCCINYLCHOLINE CHLORIDE 20 MG/ML IJ SOLN
INTRAMUSCULAR | Status: DC | PRN
  Administered 2011-04-06: 100 mg via INTRAVENOUS

## 2011-04-06 MED ORDER — LACTATED RINGERS IV SOLN
INTRAVENOUS | Status: DC
  Administered 2011-04-06 (×3): via INTRAVENOUS

## 2011-04-06 MED ORDER — GLYCOPYRROLATE 0.2 MG/ML IJ SOLN
INTRAMUSCULAR | Status: DC | PRN
  Administered 2011-04-06: .5 mg via INTRAVENOUS

## 2011-04-06 MED ORDER — KCL IN DEXTROSE-NACL 20-5-0.45 MEQ/L-%-% IV SOLN
INTRAVENOUS | Status: DC
  Administered 2011-04-06: 125 mL/h via INTRAVENOUS
  Administered 2011-04-06: 12:00:00 via INTRAVENOUS
  Administered 2011-04-07: 125 mL/h via INTRAVENOUS
  Filled 2011-04-06 (×7): qty 1000

## 2011-04-06 MED ORDER — LOSARTAN POTASSIUM-HCTZ 100-12.5 MG PO TABS: 1.0000 | ORAL_TABLET | Freq: Every day | ORAL | Status: DC

## 2011-04-06 MED ORDER — LACTATED RINGERS IV SOLN
INTRAVENOUS | Status: DC | PRN
  Administered 2011-04-06: 1000 mL

## 2011-04-06 MED ORDER — OXYCODONE-ACETAMINOPHEN 5-325 MG PO TABS
1.0000 | ORAL_TABLET | ORAL | Status: DC | PRN
  Administered 2011-04-06: 2 via ORAL
  Filled 2011-04-06: qty 2

## 2011-04-06 MED ORDER — MORPHINE SULFATE 2 MG/ML IJ SOLN
2.0000 mg | INTRAMUSCULAR | Status: DC | PRN
  Administered 2011-04-06 (×3): 2 mg via INTRAVENOUS
  Filled 2011-04-06 (×3): qty 1

## 2011-04-06 MED ORDER — ONDANSETRON HCL 4 MG/2ML IJ SOLN
INTRAMUSCULAR | Status: DC | PRN
  Administered 2011-04-06: 4 mg via INTRAVENOUS

## 2011-04-06 MED ORDER — CISATRACURIUM BESYLATE 2 MG/ML IV SOLN
INTRAVENOUS | Status: DC | PRN
  Administered 2011-04-06: 4 mg via INTRAVENOUS
  Administered 2011-04-06: 6 mg via INTRAVENOUS
  Administered 2011-04-06: 4 mg via INTRAVENOUS

## 2011-04-06 MED ORDER — MIDAZOLAM HCL 5 MG/5ML IJ SOLN
INTRAMUSCULAR | Status: DC | PRN
  Administered 2011-04-06: 2 mg via INTRAVENOUS

## 2011-04-06 MED ORDER — ONDANSETRON HCL 4 MG PO TABS: 4.0000 mg | ORAL_TABLET | Freq: Four times a day (QID) | ORAL | Status: DC | PRN

## 2011-04-06 MED ORDER — PROMETHAZINE HCL 25 MG/ML IJ SOLN
12.5000 mg | Freq: Once | INTRAMUSCULAR | Status: AC
  Administered 2011-04-06: 12.5 mg via INTRAVENOUS
  Filled 2011-04-06: qty 1

## 2011-04-06 MED ORDER — LOSARTAN POTASSIUM 50 MG PO TABS
100.0000 mg | ORAL_TABLET | Freq: Every day | ORAL | Status: DC
  Administered 2011-04-06: 100 mg via ORAL
  Filled 2011-04-06 (×2): qty 2

## 2011-04-06 MED ORDER — HYDROCHLOROTHIAZIDE 12.5 MG PO CAPS
12.5000 mg | ORAL_CAPSULE | Freq: Every day | ORAL | Status: DC
  Administered 2011-04-06: 12.5 mg via ORAL
  Filled 2011-04-06 (×2): qty 1

## 2011-04-06 MED ORDER — DEXTROSE 5 % IV SOLN
2.0000 g | Freq: Once | INTRAVENOUS | Status: AC
  Administered 2011-04-06: 2 g via INTRAVENOUS

## 2011-04-06 MED ORDER — ONDANSETRON HCL 4 MG/2ML IJ SOLN
4.0000 mg | Freq: Four times a day (QID) | INTRAMUSCULAR | Status: DC | PRN
  Administered 2011-04-06: 4 mg via INTRAVENOUS
  Filled 2011-04-06: qty 2

## 2011-04-06 MED ORDER — LABETALOL HCL 5 MG/ML IV SOLN
INTRAVENOUS | Status: DC | PRN
  Administered 2011-04-06: 5 mg via INTRAVENOUS

## 2011-04-06 MED ORDER — HYDROMORPHONE HCL PF 1 MG/ML IJ SOLN
0.2500 mg | INTRAMUSCULAR | Status: DC | PRN
  Administered 2011-04-06 (×4): 0.5 mg via INTRAVENOUS

## 2011-04-06 MED ORDER — ZOLPIDEM TARTRATE 5 MG PO TABS
5.0000 mg | ORAL_TABLET | Freq: Every evening | ORAL | Status: DC | PRN
Start: 2011-04-06 — End: 2011-04-07

## 2011-04-06 MED ORDER — NEOSTIGMINE METHYLSULFATE 1 MG/ML IJ SOLN
INTRAMUSCULAR | Status: DC | PRN
  Administered 2011-04-06: 2.5 mg via INTRAVENOUS

## 2011-04-06 MED ORDER — STERILE WATER FOR IRRIGATION IR SOLN
Status: DC | PRN
  Administered 2011-04-06: 1500 mL

## 2011-04-06 MED ORDER — MAGNESIUM HYDROXIDE 400 MG/5ML PO SUSP
30.0000 mL | Freq: Three times a day (TID) | ORAL | Status: DC
  Administered 2011-04-06 (×2): 30 mL via ORAL
  Filled 2011-04-06 (×2): qty 30

## 2011-04-06 MED ORDER — PROMETHAZINE HCL 25 MG/ML IJ SOLN: 6.2500 mg | INTRAMUSCULAR | Status: DC | PRN

## 2011-04-06 MED ORDER — FENTANYL CITRATE 0.05 MG/ML IJ SOLN
INTRAMUSCULAR | Status: DC | PRN
  Administered 2011-04-06: 100 ug via INTRAVENOUS
  Administered 2011-04-06: 50 ug via INTRAVENOUS
  Administered 2011-04-06 (×2): 100 ug via INTRAVENOUS
  Administered 2011-04-06: 50 ug via INTRAVENOUS
  Administered 2011-04-06: 100 ug via INTRAVENOUS

## 2011-04-06 MED ORDER — ACETAMINOPHEN 10 MG/ML IV SOLN
INTRAVENOUS | Status: DC | PRN
  Administered 2011-04-06: 1000 mg via INTRAVENOUS

## 2011-04-06 MED ORDER — LIDOCAINE HCL (CARDIAC) 20 MG/ML IV SOLN
INTRAVENOUS | Status: DC | PRN
  Administered 2011-04-06: 80 mg via INTRAVENOUS

## 2011-04-06 SURGICAL SUPPLY — 45 items
APL SKNCLS STERI-STRIP NONHPOA (GAUZE/BANDAGES/DRESSINGS) ×1
BAG SPEC RTRVL LRG 6X4 10 (ENDOMECHANICALS) ×1
BENZOIN TINCTURE PRP APPL 2/3 (GAUZE/BANDAGES/DRESSINGS) ×2 IMPLANT
CABLE HI FREQUENCY MONOPOLAR (ELECTROSURGICAL) ×1 IMPLANT
CHLORAPREP W/TINT 26ML (MISCELLANEOUS) ×3 IMPLANT
CLOTH BEACON ORANGE TIMEOUT ST (SAFETY) ×2 IMPLANT
CORDS BIPOLAR (ELECTRODE) ×2 IMPLANT
COVER SURGICAL LIGHT HANDLE (MISCELLANEOUS) ×2 IMPLANT
COVER TIP SHEARS 8 DVNC (MISCELLANEOUS) ×1 IMPLANT
COVER TIP SHEARS 8MM DA VINCI (MISCELLANEOUS) ×1
DECANTER SPIKE VIAL GLASS SM (MISCELLANEOUS) ×1 IMPLANT
DRAPE SURG IRRIG POUCH 19X23 (DRAPES) ×2 IMPLANT
DRAPE UTILITY XL STRL (DRAPES) ×2 IMPLANT
DRSG TEGADERM 6X8 (GAUZE/BANDAGES/DRESSINGS) ×4 IMPLANT
ELECT REM PT RETURN 9FT ADLT (ELECTROSURGICAL) ×2
ELECTRODE REM PT RTRN 9FT ADLT (ELECTROSURGICAL) ×1 IMPLANT
GAUZE SPONGE 2X2 8PLY STRL LF (GAUZE/BANDAGES/DRESSINGS) IMPLANT
GAUZE VASELINE 3X9 (GAUZE/BANDAGES/DRESSINGS) IMPLANT
GLOVE BIO SURGEON STRL SZ 6.5 (GLOVE) ×7 IMPLANT
GLOVE BIO SURGEON STRL SZ7.5 (GLOVE) ×4 IMPLANT
GLOVE BIOGEL PI IND STRL 7.0 (GLOVE) ×2 IMPLANT
GLOVE BIOGEL PI INDICATOR 7.0 (GLOVE) ×2
GOWN PREVENTION PLUS XLARGE (GOWN DISPOSABLE) ×10 IMPLANT
HOLDER FOLEY CATH W/STRAP (MISCELLANEOUS) ×1 IMPLANT
KIT ACCESSORY DA VINCI DISP (KITS) ×1
KIT ACCESSORY DVNC DISP (KITS) ×1 IMPLANT
MANIPULATOR UTERINE 4.5 ZUMI (MISCELLANEOUS) ×2 IMPLANT
OCCLUDER COLPOPNEUMO (BALLOONS) ×2 IMPLANT
PACK ROBOTIC CUSTOM GYN (CUSTOM PROCEDURE TRAY) ×2 IMPLANT
POUCH SPECIMEN RETRIEVAL 10MM (ENDOMECHANICALS) ×3 IMPLANT
SET TUBE IRRIG SUCTION NO TIP (IRRIGATION / IRRIGATOR) ×2 IMPLANT
SOLUTION ELECTROLUBE (MISCELLANEOUS) ×2 IMPLANT
SPONGE GAUZE 2X2 STER 10/PKG (GAUZE/BANDAGES/DRESSINGS) ×1
SPONGE GAUZE 4X4 12PLY (GAUZE/BANDAGES/DRESSINGS) ×1 IMPLANT
SPONGE LAP 18X18 X RAY DECT (DISPOSABLE) IMPLANT
STRIP CLOSURE SKIN 1/2X4 (GAUZE/BANDAGES/DRESSINGS) ×2 IMPLANT
SUT VIC AB 0 CT1 27 (SUTURE) ×2
SUT VIC AB 0 CT1 27XBRD ANTBC (SUTURE) ×3 IMPLANT
SUT VIC AB 4-0 PS2 27 (SUTURE) ×4 IMPLANT
SUT VICRYL 0 UR6 27IN ABS (SUTURE) ×2 IMPLANT
SYR BULB IRRIGATION 50ML (SYRINGE) IMPLANT
TRAP SPECIMEN MUCOUS 40CC (MISCELLANEOUS) ×1 IMPLANT
TROCAR BLADELESS OPT 5 100 (ENDOMECHANICALS) ×1 IMPLANT
TUBING FILTER THERMOFLATOR (ELECTROSURGICAL) ×2 IMPLANT
WATER STERILE IRR 1500ML POUR (IV SOLUTION) ×4 IMPLANT

## 2011-04-06 NOTE — Progress Notes (Signed)
Incentive spirometer given to patient. Instructed on purpose and use. Patient and family verbalized understanding.C.Donnalyn Juran,RN

## 2011-04-06 NOTE — Op Note (Signed)
PATIENT: Emily Mcdonald DATE OF BIRTH: 07-19-1955 ENCOUNTER DATE: 04/06/2011   Preop Diagnosis: grade 1 endometrioid adenocarcinoma.   Postoperative Diagnosis: same.   Surgery: Total robotic hysterectomy bilateral salpingo-oophorectomy bilateral pelvic lymph node dissection.   Surgeons:  Bernita Buffy. Duard Brady, MD; Antionette Char, MD   Assistant: Telford Nab   Anesthesia: General   Estimated blood loss: 25 ml   IVF: 2000 ml   Urine output 600 ml   Complications: None   Pathology: Uterus, cervix, bilateral tubes and ovaries, bilateral pelvic lymph nodes to pathology.   Operative findings: Normal sized uterus.Frozen section revealed a grade 1 endometrial adenocarcinoma with minimal myometrial invasion. Tumor filled the endometrial cavity.  Procedure: The patient was identified in the preoperative holding area. Informed consent was signed on the chart. Patient was seen history was reviewed and exam was performed.   The patient was then taken to the operating room and placed in the supine position with SCD hose on. General anesthesia was then induced without difficulty. She was then placed in the dorsolithotomy position. Her arms were tucked at her side with appropriate precautions on the gel pad. Shoulder blocks were then placed in the usual fashion with appropriate precautions. A OG-tube was placed to suction. First timeout was performed to confirm the patient procedure antibiotic allergy status, estimated estimated blood loss and OR time. The perineum was then prepped in the usual fashion with Betadine. A 14 French Foley was inserted into the bladder under sterile conditions. A sterile speculum was placed in the vagina. The cervix was without lesions. The cervix was grasped with a single-tooth tenaculum. The dilator without difficulty. A ZUMI with a medium Koe ring was placed without difficulty. The abdomen was then prepped with 2 Chlor prep sponges per protocol.   Patient was then  draped after the prep was dried. Second timeout was performed to confirm the above. After again confirming OG tube placement and it was to suction. A stab-wound was made in left upper quadrant 2 cm below the costal margin on the left in the midclavicular line. A 5 mm optiview port was used to assure intra-abdominal placement. The abdomen was insufflated. At this point all points during the procedure the patient's intra-abdominal pressure was not increased over 15 mm of mercury. After insufflation was complete, the patient was placed in deep Trendelenburg position. 25 cm above the pubic symphysis that area was marked the camera port and it was placed under direct visualization. Bilateral robotic ports were marked 10 cm from the midline incision at approximately 5 angle. Under direct visualization each of the trochars was placed into the abdomen. The small bowel was folded on its mesentery to allow visualization to the pelvis. The 5 mm LUQ port was then converted to a 10/12 port under direct visualization.  After assuring adequate visualization, the robot was then docked in the usual fashion. Under direct visualization the robotic instruments replaced.   The round ligament on the patient's right side was transected with monopolar cautery. The anterior and posterior leaves of the broad ligament were then taken down in the usual fashion. The ureter was identified on the medial leaf of the broad ligament. A window was made between IP and the ureter. The IP was coagulated with bipolar cautery and transected. The posterior leaf of the broad ligament was taken down to the level of the KOH ring. The bladder flap was created using meticulous dissection and pinpoint cautery. The uterine vessels were coagulated with bipolar cautery. The uterine vessels  were then transected and the C loop was created. The same procedure was performed on the patient's left side.   The pneumo-occulder in the vagina was then insufflated. The  colpotomy was then created in the usual fashion. The specimen was then delivered to the vagina and sent for frozen section.. Our attention was then drawn to opening the paravesical space on her right side the perirectal space was also opened. The obturator nerve was identified. The nodal bundle extending over the external iliac artery down to the external iliac vein was taken down using sharp dissection and monopolar cautery. The genitofemoral nerve was identified and spared. We continued our dissection down to the level of the obturator nerve. The nodal bundle superior to the obturator nerve was taken. All pedicles were noted to be hemostatic the ureter was noted to be well medial of the area of dissection. The nodal bundle was then placed and an Endo catch bag and marked with no suture.  The same procedure was performed on the left side.  The nodal bundle was placed in an Endo catch bag with one knot.  All specimens were delivered through the vagina.   At this point frozen section returned as minimal myometrial invasion of grade 1 lesion. The decision was made to stop the procedure is no further lymphadenectomy was indicated. The vaginal cuff was closed with a running 0 Vicryl on CT 1 suture. The abdomen and pelvis were copiously irrigated and noted to be hemostatic. The robotic instruments were removed under direct visualization as were the robotic trochars. The pneumoperitoneum was removed. The patient was then taken out of the Trendelenburg position. Using of 0 Vicryl on a UR 6 needle the midline port port in the left upper quadrant ports were reapproximated. The skin was closed using 4-0 Vicryl. Steri-Strips and benzoin were applied. The pneumo occluded balloon was removed from the vagina. The vagina was swabbed and noted to be hemostatic.   All instrument needle and Ray-Tec counts were correct x2. The patient tolerated the procedure well and was taken to the recovery room in stable condition. This is  Emily Mcdonald dictating an operative note on patient Emily Mcdonald.

## 2011-04-06 NOTE — Anesthesia Postprocedure Evaluation (Signed)
  Anesthesia Post-op Note  Patient: Emily Mcdonald  Procedure(s) Performed:  ROBOTIC ASSISTED TOTAL HYSTERECTOMY WITH SALPINGO OOPHERECTOMY - Bilateral Salpingoophorectomy, Bilateral Lymph Node dissection  Patient Location: PACU  Anesthesia Type: General  Level of Consciousness: awake and alert   Airway and Oxygen Therapy: Patient Spontanous Breathing  Post-op Pain: mild  Post-op Assessment: Post-op Vital signs reviewed, Patient's Cardiovascular Status Stable, Respiratory Function Stable, Patent Airway and No signs of Nausea or vomiting  Post-op Vital Signs: stable  Complications: No apparent anesthesia complications

## 2011-04-06 NOTE — Preoperative (Signed)
Beta Blockers   Reason not to administer Beta Blockers:Not Applicable 

## 2011-04-06 NOTE — H&P (View-Only) (Signed)
Consult Note: Gyn-Onc  Emily Mcdonald 55 y.o. female  CC:  Chief Complaint  Patient presents with  . Cancer    Endometrial, new pt    HPI: Patient seen in consultation at the request of Dr. Kelly Leggett regarding management of a newly diagnosed endometrial cancer. She presented with postmenopausal bleeding and ultimately underwent an endometrial biopsy which revealed endometrioid adenocarcinoma with squamous differentiation (grade 1) MRI of the pelvis is then obtained as well showing a normal uterus with a thickened endometrial stripe. There's no adnexal masses or free fluid.  Patient is not having any pelvic pain and denies any GI or GU symptoms. Her functional status is excellent.     Review of Systems: 10 point review of systems is negative except as noted above  Current Meds:  Outpatient Encounter Prescriptions as of 03/19/2011  Medication Sig Dispense Refill  . Ascorbic Acid (VITAMIN C) 1000 MG tablet Take 1,000 mg by mouth daily.        . calcium carbonate 200 MG capsule Take 250 mg by mouth 2 (two) times daily with a meal.        . Cyanocobalamin (VITAMIN B-12 CR PO) Take by mouth daily.       . fish oil-omega-3 fatty acids 1000 MG capsule Take 2 g by mouth daily.        . losartan-hydrochlorothiazide (HYZAAR) 100-12.5 MG per tablet TAKE ONE TABLET BY MOUTH EVERY DAY  30 tablet  2  . Omega-3 Fatty Acids (FISH OIL PO) Take by mouth.          Allergy:  Allergies  Allergen Reactions  . Ace Inhibitors     REACTION: cough    Social Hx:   History   Social History  . Marital Status: Married    Spouse Name: N/A    Number of Children: N/A  . Years of Education: N/A   Occupational History  . Teacher     [employer]   Social History Main Topics  . Smoking status: Never Smoker   . Smokeless tobacco: Never Used  . Alcohol Use: No  . Drug Use: No  . Sexually Active: Not Currently   Other Topics Concern  . Not on file   Social History Narrative   1-2  caffeine drinks   A day.  No regular exercise.      Past Surgical Hx: History reviewed. No pertinent past surgical history.  Past Medical Hx:  Past Medical History  Diagnosis Date  . Hypertension   . Obesity     Family Hx:  Family History  Problem Relation Age of Onset  . Breast cancer Maternal Grandmother   . Lymphoma Paternal Grandmother 70  . Heart attack Mother 53  . Diabetes Mother     late 60s.   . Diabetes Father   . Diabetes Brother   . Hypertension Brother   . Diabetes Brother   . Hypertension Brother   . Pulmonary fibrosis Mother     passed away from this    Vitals:  Blood pressure 110/70, pulse 64, temperature 98 F (36.7 C), temperature source Oral, resp. rate 16, height 5' 3.27" (1.607 m), weight 221 lb 12.8 oz (100.608 kg), last menstrual period 10/16/2010.  Physical Exam: The patient is a well-developed overweight white female in no acute distress  HEENT is negative  neck is supple thyromegaly  There is no supraclavicular or inguinal adenopathy.  The abdomen is obese soft nontender no masses, organomegaly, ascites or hernias   are noted.  Pelvic exam  EGBUS vagina bladder urethra are normal  The cervix appears normal  there is blood at the cervical os. Uterus is anterior normal shape size and consistency.  There's no adnexal masses  rectovaginal exam confirms  lower extremities are without edema or varicosities  Assessment/Plan: Grade 1 endometrial adenocarcinoma.  I had a discussion with the patient and her husband and 2 daughters regarding endometrial cancer. I recommend she undergo a hysterectomy with bilateral salpingo-oophorectomy. Intraoperative frozen section to decide as to whether pelvic and para-aortic lymphadenectomy should be performed. Pros and cons of open versus robotic of hysterectomy were discussed and the patient would prefer to have robotic hysterectomy. She will undergo preoperative testing and we will schedule a surgery in the near  future.  The risks of surgery including hemorrhage infection injury to adjacent viscera thromboembolic complications and anesthetic risks were all discussed. All questions are answered.   CLARKE-PEARSON,Tylyn Stankovich L, MD 03/19/2011, 4:59 PM  

## 2011-04-06 NOTE — Interval H&P Note (Signed)
History and Physical Interval Note:   04/06/2011   7:01 AM   Emily Mcdonald  has presented today for surgery, with the diagnosis of endometrial cancer  The various methods of treatment have been discussed with the patient and family. After consideration of risks, benefits and other options for treatment, the patient has consented to  Procedure(s): ROBOTIC ASSISTED TOTAL HYSTERECTOMY WITH SALPINGO OOPHERECTOMY as a surgical intervention .  The patients' history has been reviewed, patient examined, no change in status, stable for surgery.  I have reviewed the patients' chart and labs.  Questions were answered to the patient's satisfaction.     [name] A.  MD

## 2011-04-06 NOTE — Transfer of Care (Signed)
Immediate Anesthesia Transfer of Care Note  Patient: Emily Mcdonald  Procedure(s) Performed:  ROBOTIC ASSISTED TOTAL HYSTERECTOMY WITH SALPINGO OOPHERECTOMY - Bilateral Salpingoophorectomy, Bilateral Lymph Node dissection  Patient Location: PACU  Anesthesia Type: General  Level of Consciousness: awake, alert  and oriented  Airway & Oxygen Therapy: Patient Spontanous Breathing and Patient connected to face mask oxygen  Post-op Assessment: Report given to PACU RN  Post vital signs: Reviewed and stable  Complications: No apparent anesthesia complications

## 2011-04-06 NOTE — Anesthesia Procedure Notes (Signed)
Procedure Name: Intubation Date/Time: 04/06/2011 7:33 AM Performed by: Hulan Fess Pre-anesthesia Checklist: Patient identified, Emergency Drugs available, Suction available, Patient being monitored and Timeout performed Patient Re-evaluated:Patient Re-evaluated prior to inductionOxygen Delivery Method: Circle System Utilized Preoxygenation: Pre-oxygenation with 100% oxygen Intubation Type: IV induction Ventilation: Mask ventilation without difficulty Laryngoscope Size: Mac and 3 Grade View: Grade I Tube type: Oral Tube size: 8.0 mm Number of attempts: 1 Placement Confirmation: ETT inserted through vocal cords under direct vision,  breath sounds checked- equal and bilateral and positive ETCO2 Secured at: 21 cm Tube secured with: Tape Dental Injury: Teeth and Oropharynx as per pre-operative assessment

## 2011-04-06 NOTE — Anesthesia Preprocedure Evaluation (Signed)
Anesthesia Evaluation  Patient identified by MRN, date of birth, ID band Patient awake    Reviewed: Allergy & Precautions, H&P , NPO status , Patient's Chart, lab work & pertinent test results  Airway Mallampati: II TM Distance: >3 FB Neck ROM: Full    Dental No notable dental hx. (+) Teeth Intact   Pulmonary neg pulmonary ROS,  clear to auscultation  Pulmonary exam normal       Cardiovascular hypertension, neg cardio ROS Regular Normal    Neuro/Psych Negative Neurological ROS  Negative Psych ROS   GI/Hepatic negative GI ROS, Neg liver ROS,   Endo/Other  Negative Endocrine ROS  Renal/GU negative Renal ROS  Genitourinary negative   Musculoskeletal negative musculoskeletal ROS (+)   Abdominal (+) obese,   Peds negative pediatric ROS (+)  Hematology negative hematology ROS (+)   Anesthesia Other Findings   Reproductive/Obstetrics negative OB ROS                           Anesthesia Physical Anesthesia Plan  ASA: II  Anesthesia Plan: General   Post-op Pain Management:    Induction: Intravenous  Airway Management Planned: Oral ETT  Additional Equipment:   Intra-op Plan:   Post-operative Plan: Extubation in OR  Informed Consent: I have reviewed the patients History and Physical, chart, labs and discussed the procedure including the risks, benefits and alternatives for the proposed anesthesia with the patient or authorized representative who has indicated his/her understanding and acceptance.   Dental advisory given  Plan Discussed with: CRNA  Anesthesia Plan Comments:         Anesthesia Quick Evaluation

## 2011-04-07 LAB — CBC
HCT: 33.2 % — ABNORMAL LOW (ref 36.0–46.0)
Hemoglobin: 10.7 g/dL — ABNORMAL LOW (ref 12.0–15.0)
MCV: 86 fL (ref 78.0–100.0)
RBC: 3.86 MIL/uL — ABNORMAL LOW (ref 3.87–5.11)
RDW: 13.6 % (ref 11.5–15.5)
WBC: 8.8 10*3/uL (ref 4.0–10.5)

## 2011-04-07 MED ORDER — OXYCODONE-ACETAMINOPHEN 5-325 MG PO TABS: 2.0000 | ORAL_TABLET | Freq: Four times a day (QID) | ORAL | Status: AC | PRN

## 2011-04-07 NOTE — Discharge Summary (Signed)
  Physician Discharge Summary  Patient ID: Emily Mcdonald MRN: [id number] DOB/AGE: February 07, 1956 55 y.o.  Admit date: 04/06/2011 Discharge date: 04/07/2011  Admission Diagnoses: Endometrial cancer  Discharge Diagnoses:  Endometrial cancer  Discharged Condition: good  Hospital Course: The patient underwent a TRH/BSO/PND.   Her postoperative observation period was uneventful.  Consults: none  Significant Diagnostic Studies: none  Treatments: surgery: see above  Discharge Exam: Blood pressure 107/71, pulse 92, temperature 98.4 F (36.9 C), temperature source Oral, resp. rate 16, height 5\' 3"  (1.6 m), weight 100.699 kg (222 lb), last menstrual period 10/16/2010, SpO2 94.00%. General appearance: alert GI: soft, non-tender; bowel sounds normal; no masses,  no organomegaly Extremities: extremities normal, atraumatic, no cyanosis or edema Incision/Wound:  C/D/I  Disposition: Final discharge disposition not confirmed  Discharge Orders    Future Orders Please Complete By Expires   Diet - low sodium heart healthy      Increase activity slowly      May walk up steps      May shower / Bathe      Comments:   No tub baths for 6 weeks   Driving Restrictions      Comments:   No driving for 1- 2 weeks   Lifting restrictions      Comments:   No lifting > 30 lbs for 6 weeks   Sexual Activity Restrictions      Comments:   No intercourse for 6 - 8 weeks   Discharge wound care:      Comments:   Keep clean and dry   Call MD for:  temperature >100.4      Call MD for:  persistant nausea and vomiting      Call MD for:  severe uncontrolled pain      Call MD for:  redness, tenderness, or signs of infection (pain, swelling, redness, odor or green/yellow discharge around incision site)      Call MD for:  persistant dizziness or light-headedness      Call MD for:  extreme fatigue        Current Discharge Medication List    START taking these medications   Details  oxyCODONE-acetaminophen  (PERCOCET) 5-325 MG per tablet Take 2 tablets by mouth every 6 (six) hours as needed for pain. Qty: 30 tablet, Refills: 0      CONTINUE these medications which have NOT CHANGED   Details  Ascorbic Acid (VITAMIN C) 1000 MG tablet Take 1,000 mg by mouth daily.      calcium carbonate 200 MG capsule Take 250 mg by mouth 2 (two) times daily with a meal.      Cyanocobalamin (VITAMIN B-12 CR PO) Take 1 tablet by mouth daily.     losartan-hydrochlorothiazide (HYZAAR) 100-12.5 MG per tablet     fish oil-omega-3 fatty acids 1000 MG capsule Take 2 g by mouth daily.       STOP taking these medications     acetaminophen (TYLENOL) 500 MG tablet        Follow-up Information    Call to follow up. (Call gyn oncology for appointment )          Signed: Roseanna Rainbow 04/07/2011, 8:27 AM

## 2011-04-07 NOTE — Progress Notes (Signed)
1 Day Post-Op Procedure(s) (LRB): ROBOTIC ASSISTED TOTAL HYSTERECTOMY WITH SALPINGO OOPHERECTOMY (N/A)  Subjective: Patient reports no complaints.  Objective: Vital signs in last 24 hours: Temp:  [96.8 F (36 C)-98.7 F (37.1 C)] 98.4 F (36.9 C) (11/28 0600) Pulse Rate:  [68-92] 92  (11/28 0600) Resp:  [14-16] 16  (11/28 0600) BP: (88-135)/(55-83) 107/71 mmHg (11/28 0600) SpO2:  [93 %-100 %] 94 % (11/28 0600) Weight:  [100.699 kg (222 lb)] 222 lb (100.699 kg) (11/27 1135) Last BM Date: 04/06/11  Intake/Output from previous day: 11/27 0701 - 11/28 0700 In: 4028.6 [P.O.:480; I.V.:3548.6] Out: 4025 [Urine:4000; Blood:25] Intake/Output this shift: Total I/O In: -  Out: 400 [Urine:400]  Physical Examination: General: alert GI: soft, non-tender; bowel sounds normal; no masses,  no organomegaly and incision: clean, dry and intact Extremities: extremities normal, atraumatic, no cyanosis or edema  Labs: WBC/Hgb/Hct/Plts:  8.8/10.7/33.2/165 (11/28 0400)     Assessment:  55 y.o. s/p Procedure(s): ROBOTIC ASSISTED TOTAL HYSTERECTOMY WITH SALPINGO OOPHERECTOMY: progressing well Pain:  Pain is well-controlled on oral medications.  CV: Hypertension:  controlled. [name]  GI:  Tolerating po: Yes      Prophylaxis: intermittent pneumatic compression boots.  Plan: Discharge home  The patient is to be discharged to home.   LOS: 1 day    Mcdonald,Emily Ferg A 04/07/2011, 8:10 AM

## 2011-05-06 ENCOUNTER — Encounter: Payer: Self-pay | Admitting: Gynecologic Oncology

## 2011-05-07 ENCOUNTER — Encounter: Payer: Self-pay | Admitting: Gynecology

## 2011-05-07 ENCOUNTER — Ambulatory Visit: Payer: BC Managed Care – PPO | Attending: Gynecology | Admitting: Gynecology

## 2011-05-07 VITALS — BP 110/70 | HR 84 | Temp 98.3°F | Resp 16 | Ht 63.78 in | Wt 219.4 lb

## 2011-05-07 DIAGNOSIS — M129 Arthropathy, unspecified: Secondary | ICD-10-CM | POA: Insufficient documentation

## 2011-05-07 DIAGNOSIS — C541 Malignant neoplasm of endometrium: Secondary | ICD-10-CM

## 2011-05-07 DIAGNOSIS — Z90710 Acquired absence of both cervix and uterus: Secondary | ICD-10-CM | POA: Insufficient documentation

## 2011-05-07 DIAGNOSIS — C549 Malignant neoplasm of corpus uteri, unspecified: Secondary | ICD-10-CM | POA: Insufficient documentation

## 2011-05-07 DIAGNOSIS — Z79899 Other long term (current) drug therapy: Secondary | ICD-10-CM | POA: Insufficient documentation

## 2011-05-07 DIAGNOSIS — I10 Essential (primary) hypertension: Secondary | ICD-10-CM | POA: Insufficient documentation

## 2011-05-07 DIAGNOSIS — Z9079 Acquired absence of other genital organ(s): Secondary | ICD-10-CM | POA: Insufficient documentation

## 2011-05-07 NOTE — Progress Notes (Signed)
Consult Note: Gyn-Onc   Emily Mcdonald 55 y.o. female  Chief Complaint  Patient presents with  . Endometrial cancer    Follow-up    Interval History: The patient returns for her first postoperative check following robotic hysterectomy for a grade 1 stage Ia adenocarcinoma the endometrium. Patient had an uncomplicated postoperative course. Energy level is excellent she denies any GI or GU symptoms.   HPI: stage IA grade 1 endometrial adenocarcinoma treated with a robotic assisted hysterectomy and bilateral salpingo-vitrectomy and pelvic lymphadenectomy on 04/06/2011.   Allergies  Allergen Reactions  . Ace Inhibitors     REACTION: cough    Past Medical History  Diagnosis Date  . Obesity   . Hypertension 03-31-11    tx. med  . Cancer 03-31-11    dx. endometrial cancer , surgery planned  . Arthritis 03-31-11    arthritis fingers, and toes more right sided    Past Surgical History  Procedure Date  . Abdominal hysterectomy 04/06/11    Robotic BSO, LND    Current Outpatient Prescriptions  Medication Sig Dispense Refill  . Ascorbic Acid (VITAMIN C) 1000 MG tablet Take 1,000 mg by mouth daily.        . calcium carbonate 200 MG capsule Take 250 mg by mouth 2 (two) times daily with a meal.        . Cyanocobalamin (VITAMIN B-12 CR PO) Take 1 tablet by mouth daily.       . fish oil-omega-3 fatty acids 1000 MG capsule Take 2 g by mouth daily.       [name] losartan-hydrochlorothiazide (HYZAAR) 100-12.5 MG per tablet 1 tablet daily.         History   Social History  . Marital Status: Married    Spouse Name: N/A    Number of Children: N/A  . 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 Smoking status: Never Smoker   . Smokeless tobacco: Never Used  . Alcohol Use: No  . Drug Use: No  . Sexually Active: No   Other Topics Concern  . Not on file   Social History Narrative   1-2 caffeine drinks   A  day.  No regular exercise.      Family History  Problem Relation Age of Onset  . Breast cancer Maternal Grandmother   . Lymphoma Paternal Grandmother 88  . Heart attack Mother 43  . Diabetes Mother     late 21s.   . Diabetes Father   . Diabetes Brother   . Hypertension Brother   . Diabetes Brother   . Hypertension Brother   . Pulmonary fibrosis Mother     passed away from this    Review of Systems: 10 point review of systems is negative except as noted above   Vitals: Blood pressure 110/70, pulse 84, temperature 98.3 F (36.8 C), temperature source Oral, resp. rate 16, height 5' 3.78" (1.62 m), weight 219 lb 6.4 oz (99.519 kg), last menstrual period 10/16/2010.  Physical Exam: in general this is a healthy white female no acute distress  HEENT is negative  The abdomen is soft and nontender all laparoscopic incisions are healing well.  Pelvic exam EGBUS vagina bladder urethra are normal the vaginal cuff is healing. Some sutures still apparent. Bimanual exam reveals postoperative induration at the cuff without any masses nodularity or tenderness.  Assessment/Plan: stage IA grade 1 endometrial carcinoma.  The patient has had an uncomplicated postoperative course and may return to full levels of activity.  She will  return to see [place] in 6 months for followup.   Jeannette Corpus, MD 05/07/2011, 2:24 PM                         Consult Note: Gyn-Onc   Emily Mcdonald 55 y.o. female  Chief Complaint  Patient presents with  . Endometrial cancer    Follow-up    Interval History:   HPI:  Allergies  Allergen Reactions  . Ace Inhibitors     REACTION: cough    Past Medical History  Diagnosis Date  . Obesity   . Hypertension 03-31-11    tx. med  . Cancer 03-31-11    dx. endometrial cancer , surgery planned  . Arthritis 03-31-11    arthritis fingers, and toes more right sided    Past Surgical History  Procedure Date  . Abdominal  hysterectomy 04/06/11    Robotic BSO, LND    Current Outpatient Prescriptions  Medication Sig Dispense Refill  . Ascorbic Acid (VITAMIN C) 1000 MG tablet Take 1,000 mg by mouth daily.        . calcium carbonate 200 MG capsule Take 250 mg by mouth 2 (two) times daily with a meal.        . Cyanocobalamin (VITAMIN B-12 CR PO) Take 1 tablet by mouth daily.       . fish oil-omega-3 fatty acids 1000 MG capsule Take 2 g by mouth daily.       [name] losartan-hydrochlorothiazide (HYZAAR) 100-12.5 MG per tablet 1 tablet daily.         History   Social History  . Marital Status: Married    Spouse Name: N/A    Number of Children: N/A  . 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 Smoking status: Never Smoker   . Smokeless tobacco: Never Used  . Alcohol Use: No  . Drug Use: No  . Sexually Active: No   Other Topics Concern  . Not on file   Social History Narrative   1-2 caffeine drinks   A day.  No regular exercise.      Family History  Problem Relation Age of Onset  . Breast cancer Maternal Grandmother   . Lymphoma Paternal Grandmother 70  . Heart attack Mother 81  . Diabetes Mother     late 80s.   . Diabetes Father   . Diabetes Brother   . Hypertension Brother   . Diabetes Brother   . Hypertension Brother   . Pulmonary fibrosis Mother     passed away from this    Review of Systems:  Vitals: Blood pressure 110/70, pulse 84, temperature 98.3 F (36.8 C), temperature source Oral, resp. rate 16, height 5' 3.78" (1.62 m), weight 219 lb 6.4 oz (99.519 kg), last menstrual period 10/16/2010.  Physical Exam:  Assessment/Plan:   Jeannette Corpus, MD 05/07/2011, 2:24 PM

## 2011-05-07 NOTE — Patient Instructions (Signed)
May return to full levels of activity.  Return in 6 months for followup

## 2011-05-25 ENCOUNTER — Other Ambulatory Visit: Payer: Self-pay | Admitting: Family Medicine

## 2011-08-25 ENCOUNTER — Other Ambulatory Visit: Payer: Self-pay | Admitting: Family Medicine

## 2011-10-28 ENCOUNTER — Other Ambulatory Visit: Payer: Self-pay | Admitting: Family Medicine

## 2011-11-05 ENCOUNTER — Encounter: Payer: Self-pay | Admitting: Gynecology

## 2011-11-05 ENCOUNTER — Ambulatory Visit: Payer: BC Managed Care – PPO | Attending: Gynecology | Admitting: Gynecology

## 2011-11-05 ENCOUNTER — Other Ambulatory Visit (HOSPITAL_COMMUNITY)
Admission: RE | Admit: 2011-11-05 | Discharge: 2011-11-05 | Disposition: A | Discharge status: Home / Self Care | Payer: BC Managed Care – PPO | Source: Ambulatory Visit | Attending: Gynecology | Admitting: Gynecology

## 2011-11-05 VITALS — BP 118/64 | HR 70 | Temp 98.6°F | Resp 20 | Ht 63.78 in | Wt 210.0 lb

## 2011-11-05 DIAGNOSIS — Z888 Allergy status to other drugs, medicaments and biological substances status: Secondary | ICD-10-CM | POA: Insufficient documentation

## 2011-11-05 DIAGNOSIS — Z01419 Encounter for gynecological examination (general) (routine) without abnormal findings: Secondary | ICD-10-CM | POA: Insufficient documentation

## 2011-11-05 DIAGNOSIS — Z124 Encounter for screening for malignant neoplasm of cervix: Secondary | ICD-10-CM | POA: Insufficient documentation

## 2011-11-05 DIAGNOSIS — Z90710 Acquired absence of both cervix and uterus: Secondary | ICD-10-CM | POA: Insufficient documentation

## 2011-11-05 DIAGNOSIS — E669 Obesity, unspecified: Secondary | ICD-10-CM | POA: Insufficient documentation

## 2011-11-05 DIAGNOSIS — M19079 Primary osteoarthritis, unspecified ankle and foot: Secondary | ICD-10-CM | POA: Insufficient documentation

## 2011-11-05 DIAGNOSIS — M19049 Primary osteoarthritis, unspecified hand: Secondary | ICD-10-CM | POA: Insufficient documentation

## 2011-11-05 DIAGNOSIS — Z833 Family history of diabetes mellitus: Secondary | ICD-10-CM | POA: Insufficient documentation

## 2011-11-05 DIAGNOSIS — Z8542 Personal history of malignant neoplasm of other parts of uterus: Secondary | ICD-10-CM | POA: Insufficient documentation

## 2011-11-05 DIAGNOSIS — C541 Malignant neoplasm of endometrium: Secondary | ICD-10-CM

## 2011-11-05 DIAGNOSIS — Z8489 Family history of other specified conditions: Secondary | ICD-10-CM | POA: Insufficient documentation

## 2011-11-05 DIAGNOSIS — Z8249 Family history of ischemic heart disease and other diseases of the circulatory system: Secondary | ICD-10-CM | POA: Insufficient documentation

## 2011-11-05 DIAGNOSIS — I10 Essential (primary) hypertension: Secondary | ICD-10-CM | POA: Insufficient documentation

## 2011-11-05 NOTE — Progress Notes (Signed)
Consult Note: Gyn-Onc   Emily Mcdonald 56 y.o. female  Chief Complaint  Patient presents with  . Endo ca    Follow up    Interval History: The patient returns for continued followup.  She underwent a robotic hysterectomy for a grade 1 stage Ia adenocarcinoma the endometrium in November 2012.  Since her last visit, she has done well. Energy level is excellent she denies any GI or GU symptoms. She has no pelvic symptoms or bleeding.  Mammograms are up to date. HPI: stage IA grade 1 endometrial adenocarcinoma treated with a robotic assisted hysterectomy and bilateral salpingo-vitrectomy and pelvic lymphadenectomy on 04/06/2011. She had good prognostic pathologic features and did not require and adjunct therapy.   Review of Systems:10 point review of systems is negative as noted above.   Vitals: Blood pressure 118/64, pulse 70, temperature 98.6 F (37 C), resp. rate 20, height 5' 3.78" (1.62 m), weight 210 lb (95.255 kg), last menstrual period 10/16/2010.  Physical Exam: General : The patient is a healthy woman in no acute distress.  HEENT: normocephalic, extraoccular movements normal; neck is supple without thyromegally  Lynphnodes: Supraclavicular and inguinal nodes not enlarged  Abdomen: Soft, non-tender, no ascites, no organomegally, no masses, no hernias  Pelvic:  EGBUS: Normal female  Vagina: Normal, no lesions  Urethra and Bladder: Normal, non-tender  Cervix: Surgically absent  Uterus: Surgically absent  Bi-manual examination: Non-tender; no adenxal masses or nodularity  Rectal: normal sphincter tone, no masses, no blood  Lower extremities: No edema or varicosities. Normal range of motion    Assessment/Plan: Stage IA grade 1 endometrial carcinoma November 2012. The patient is clinically free of disease  Pap smears are obtained  She return to see Dr. Penne Lash in 3 months return to see [place] in 6 months.  Allergies  Allergen Reactions  . Ace Inhibitors     REACTION: cough     Past Medical History  Diagnosis Date  . Obesity   . Hypertension 03-31-11    tx. med  . Cancer 03-31-11    dx. endometrial cancer , surgery planned  . Arthritis 03-31-11    arthritis fingers, and toes more right sided    Past Surgical History  Procedure Date  . Abdominal hysterectomy 04/06/11    Robotic BSO, LND    Current Outpatient Prescriptions  Medication Sig Dispense Refill  . Ascorbic Acid (VITAMIN C) 1000 MG tablet Take 1,000 mg by mouth daily.        . calcium carbonate 200 MG capsule Take 250 mg by mouth 2 (two) times daily with a meal.        . Cyanocobalamin (VITAMIN B-12 CR PO) Take 1 tablet by mouth daily.       . fish oil-omega-3 fatty acids 1000 MG capsule Take 2 g by mouth daily.       [name] losartan-hydrochlorothiazide (HYZAAR) 100-12.5 MG per tablet TAKE ONE TABLET BY MOUTH EVERY DAY  30 tablet  0  . DISCONTD: losartan-hydrochlorothiazide (HYZAAR) 100-12.5 MG per tablet 1 tablet daily.         History   Social History  . Marital Status: Married    Spouse Name: N/A    Number of Children: N/A  . 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 Smoking status: Never Smoker   . Smokeless tobacco: Never Used  . Alcohol Use: No  . Drug Use: No  .  Sexually Active: No   Other Topics Concern  . Not on file   Social History Narrative   1-2 caffeine drinks   A day.  No regular exercise.      Family History  Problem Relation Age of Onset  . Breast cancer Maternal Grandmother   . Lymphoma Paternal Grandmother 37  . Heart attack Mother 53  . Diabetes Mother     late 69s.   . Diabetes Father   . Diabetes Brother   . Hypertension Brother   . Diabetes Brother   . Hypertension Brother   . Pulmonary fibrosis Mother     passed away from this      Jeannette Corpus, MD 11/05/2011, 2:43 PM

## 2011-11-05 NOTE — Patient Instructions (Signed)
Please schedule to see Dr. Penne Lash in 3 months and we will see you in 6 months. We'll contact you if the Pap smear report.

## 2011-11-15 ENCOUNTER — Telehealth: Payer: Self-pay | Admitting: Gynecologic Oncology

## 2011-11-15 NOTE — Telephone Encounter (Signed)
Pt notified about pap results: negative.  No questions or concerns voiced. 

## 2011-11-29 ENCOUNTER — Other Ambulatory Visit: Payer: Self-pay | Admitting: Family Medicine

## 2011-12-07 ENCOUNTER — Ambulatory Visit (INDEPENDENT_AMBULATORY_CARE_PROVIDER_SITE_OTHER): Payer: BC Managed Care – PPO | Admitting: Family Medicine

## 2011-12-07 ENCOUNTER — Encounter: Payer: Self-pay | Admitting: Family Medicine

## 2011-12-07 VITALS — BP 95/66 | HR 72 | Ht 63.78 in | Wt 210.0 lb

## 2011-12-07 DIAGNOSIS — I10 Essential (primary) hypertension: Secondary | ICD-10-CM

## 2011-12-07 DIAGNOSIS — Z0000 Encounter for general adult medical examination without abnormal findings: Secondary | ICD-10-CM

## 2011-12-07 DIAGNOSIS — Z1231 Encounter for screening mammogram for malignant neoplasm of breast: Secondary | ICD-10-CM

## 2011-12-07 MED ORDER — LOSARTAN POTASSIUM 100 MG PO TABS: 100.0000 mg | ORAL_TABLET | Freq: Every day | ORAL | Status: DC

## 2011-12-07 NOTE — Patient Instructions (Addendum)
Start a regular exercise program and make sure you are eating a healthy diet Try to eat 4 servings of dairy a day  Your vaccines are up to date.   

## 2011-12-07 NOTE — Progress Notes (Signed)
  Subjective:     Emily Mcdonald is a 56 y.o. female and is here for a comprehensive physical exam. The patient reports no problems. Hx of endometrial cancer. Still being followed by Dr. Stanford Breed as well as Dr. Penne Lash. She has an appointment with Dr. Penne Lash in September.  History   Social History  . Marital Status: Married    Spouse Name: N/A    Number of Children: N/A  . 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 Smoking status: Never Smoker   . Smokeless tobacco: Never Used  . Alcohol Use: No  . Drug Use: No  . Sexually Active: No   Other Topics Concern  . Not on file   Social History Narrative   1-2 caffeine drinks   A day.  No regular exercise.     Health Maintenance  Topic Date Due  . Influenza Vaccine  02/08/2012  . Mammogram  11/30/2012  . Pap Smear  11/05/2014  . Tetanus/tdap  05/11/2015  . Colonoscopy  10/09/2015    The following portions of the patient's history were reviewed and updated as appropriate: allergies, current medications, past family history, past medical history, past social history, past surgical history and problem list.  Review of Systems A comprehensive review of systems was negative.   Objective:    BP 95/66  Pulse 72  Ht 5' 3.78" (1.62 m)  Wt 210 lb (95.255 kg)  BMI 36.30 kg/m2  SpO2 99%  LMP 10/16/2010 General appearance: alert, cooperative and appears stated age Head: Normocephalic, without obvious abnormality, atraumatic Eyes: conj clear, EOMi, PEERLA Ears: normal TM's and external ear canals both ears Nose: Nares normal. Septum midline. Mucosa normal. No drainage or sinus tenderness. Throat: lips, mucosa, and tongue normal; teeth and gums normal Neck: no adenopathy, no carotid bruit, no JVD, supple, symmetrical, trachea midline and thyroid not enlarged, symmetric, no tenderness/mass/nodules Back: symmetric, no curvature. ROM normal. No CVA  tenderness. Lungs: clear to auscultation bilaterally Breasts: normal appearance, no masses or tenderness Heart: regular rate and rhythm, S1, S2 normal, no murmur, click, rub or gallop Abdomen: soft, non-tender; bowel sounds normal; no masses,  no organomegaly Extremities: extremities normal, atraumatic, no cyanosis or edema Pulses: 2+ and symmetric Skin: Skin color, texture, turgor normal. No rashes or lesions Lymph nodes: Cervical, supraclavicular, and axillary nodes normal. Neurologic: Reflexes: 2+ and symmetric    Assessment:    Healthy female exam.      Plan:     See After Visit Summary for Counseling Recommendations  Start a regular exercise program and make sure you are eating a healthy diet Try to eat 4 servings of dairy a day or take a calcium supplement (500mg  twice a day). Your vaccines are up to date.   Endometrial cancer - Doing well so far. Has f/u with Dr. Penne Lash in September.   HTN - well controlled.  It is actually a little low today. She has been exercising and eating better.  Will stop the hctz and continue the lisinopril.  She can recheck her blood pressure with Dr. Penne Lash in the fall. It looks great and otherwise I will see her in 6 months from today. If it's elevated at her followup because we are reducing her pill, I asked her call me in 3 weeks to make a followup appointment. Due for CMP and lipid screening.

## 2011-12-08 ENCOUNTER — Ambulatory Visit: Payer: BC Managed Care – PPO | Admitting: Gynecologic Oncology

## 2011-12-08 LAB — COMPLETE METABOLIC PANEL WITH GFR
ALT: 13 U/L (ref 0–35)
Albumin: 4.3 g/dL (ref 3.5–5.2)
CO2: 27 mEq/L (ref 19–32)
Calcium: 9.6 mg/dL (ref 8.4–10.5)
Chloride: 103 mEq/L (ref 96–112)
GFR, Est African American: >89 mL/min
Glucose, Bld: 81 mg/dL (ref 70–99)
Potassium: 4.4 mEq/L (ref 3.5–5.3)
Sodium: 140 mEq/L (ref 135–145)
Total Bilirubin: 0.6 mg/dL (ref 0.3–1.2)
Total Protein: 7.1 g/dL (ref 6.0–8.3)

## 2011-12-08 LAB — LIPID PANEL
HDL: 51 mg/dL (ref >39)
LDL Cholesterol: 112 mg/dL — ABNORMAL HIGH (ref 0–99)

## 2011-12-08 LAB — TSH: TSH: 2.693 u[IU]/mL (ref 0.350–4.500)

## 2011-12-14 ENCOUNTER — Ambulatory Visit (INDEPENDENT_AMBULATORY_CARE_PROVIDER_SITE_OTHER): Payer: BC Managed Care – PPO

## 2011-12-14 DIAGNOSIS — Z1231 Encounter for screening mammogram for malignant neoplasm of breast: Secondary | ICD-10-CM

## 2012-02-14 ENCOUNTER — Encounter: Payer: Self-pay | Admitting: Family Medicine

## 2012-02-14 ENCOUNTER — Ambulatory Visit (INDEPENDENT_AMBULATORY_CARE_PROVIDER_SITE_OTHER): Payer: BC Managed Care – PPO | Admitting: Family Medicine

## 2012-02-14 VITALS — BP 120/74 | HR 83 | Temp 98.1°F | Ht 63.0 in | Wt 211.0 lb

## 2012-02-14 DIAGNOSIS — Z23 Encounter for immunization: Secondary | ICD-10-CM

## 2012-02-14 DIAGNOSIS — N390 Urinary tract infection, site not specified: Secondary | ICD-10-CM

## 2012-02-14 MED ORDER — CIPROFLOXACIN HCL 500 MG PO TABS: 500.0000 mg | ORAL_TABLET | Freq: Two times a day (BID) | ORAL | Status: DC

## 2012-02-14 NOTE — Progress Notes (Signed)
  Subjective:    Patient ID: Emily Mcdonald, female    DOB: 11-08-1955, 56 y.o.   MRN: [id number]  HPI Low back pain for 4-5 days and then 3 days of urgency. Some blood in the urine today, so she made an appointment.. No fever.  Taking AZO.  She had been moving so she initially thought the back pain was related to that.   Review of Systems     Objective:   Physical Exam  Constitutional: She appears well-developed and well-nourished.  Musculoskeletal:       No CVA tenderness  Skin: Skin is warm and dry.  Psychiatric: She has a normal mood and affect. Her behavior is normal.          Assessment & Plan:  UTI - will tx with cipro. Call if not better in 5 days.  Will send for culture. Didn't run UA since on AZO.    Given flu vaccine today.

## 2012-02-14 NOTE — Patient Instructions (Addendum)
Urinary Tract Infection in Pregnancy  A urinary tract infection (UTI) is a bacterial infection of the urinary tract. Infection of the urinary tract can include the ureters, kidneys (pyelonephritis), bladder (cystitis), and urethra (urethritis). All pregnant women should be screened for bacteria in the urinary tract. Identifying and treating a UTI will decrease the risk of preterm labor and developing more serious infections in both the mother and baby.  CAUSES  Bacteria germs cause almost all UTIs. There are many factors that can increase your chances of getting a UTI during pregnancy. These include:   Having a short urethra.   Poor toilet and hygiene habits.   Sexual intercourse.   Blockage of urine along the urinary tract.   Problems with the pelvic muscles or nerves.   Diabetes.   Obesity.   Bladder problems after having several children.   Previous history of UTI.  SYMPTOMS    Pain, burning, or a stinging feeling when urinating.   Suddenly feeling the need to urinate right away (urgency).   Loss of bladder control (urinary incontinence).   Frequent urination, more than is common with pregnancy.   Lower abdominal or back discomfort.   Bad smelling urine.   Cloudy urine.   Blood in the urine (hematuria).   Fever.  When the kidneys are infected, the symptoms may be:   Back pain.   Flank pain on the right side more so than the left.   Fever.   Chills.   Nausea.   Vomiting.  DIAGNOSIS    Urine tests.   Additional tests and procedures may include:   Ultrasound of the kidneys, ureters, bladder, and urethra.   Looking in the bladder with a lighted tube (cystoscopy).   Certain X-ray studies only when absolutely necessary.  Finding out the results of your test  Ask when your test results will be ready. Make sure you get your test results.  TREATMENT   Antibiotic medicine by mouth.   Antibiotics given through the vein (intravenously), if needed.  HOME CARE INSTRUCTIONS    Take your  antibiotics as directed. Finish them even if you start to feel better. Only take medicine as directed by your caregiver.   Drink enough fluids to keep your urine clear or pale yellow.   Do not have sexual intercourse until the infection is gone and your caregiver says it is okay.   Make sure you are tested for UTIs throughout your pregnancy if you get one. These infections often come back.  Preventing a UTI in the future:   Practice good toilet habits. Always wipe from front to back. Use the tissue only once.   Do not hold your urine. Empty your bladder as soon as possible when the urge comes.   Do not douche or use deodorant sprays.   Wash with soap and warm water around the genital area and the anus.   Empty your bladder before and after sexual intercourse.   Wear underwear with a cotton crotch.   Avoid caffeine and carbonated drinks. They can irritate the bladder.   Drink cranberry juice or take cranberry pills. This may decrease the risk of getting a UTI.   Do not drink alcohol.   Keep all your appointments and tests as scheduled.  SEEK MEDICAL CARE IF:    Your symptoms get worse.   You are still having fevers 2 or more days after treatment begins.   You develop a rash.   You feel that you are having problems with   medicines prescribed.   You develop abnormal vaginal discharge.  SEEK IMMEDIATE MEDICAL CARE IF:    You develop back or flank pain.   You develop chills.   You have blood in your urine.   You develop nausea and vomiting.   You develop contractions of your uterus.   You have a gush of fluid from the vagina.  MAKE SURE YOU:    Understand these instructions.   Will watch your condition.   Will get help right away if you are not doing well or get worse.  Document Released: 08/21/2010 Document Revised: 07/19/2011 Document Reviewed: 08/21/2010  Exit[employer] Patient Information 2013 [employer].

## 2012-02-16 LAB — URINE CULTURE: Colony Count: >=100000

## 2012-03-13 ENCOUNTER — Encounter: Payer: Self-pay | Admitting: Physician Assistant

## 2012-03-13 ENCOUNTER — Other Ambulatory Visit: Payer: Self-pay | Admitting: Physician Assistant

## 2012-03-13 ENCOUNTER — Ambulatory Visit (INDEPENDENT_AMBULATORY_CARE_PROVIDER_SITE_OTHER): Payer: BC Managed Care – PPO | Admitting: Physician Assistant

## 2012-03-13 VITALS — BP 108/70 | HR 76 | Temp 98.1°F | Wt 214.0 lb

## 2012-03-13 DIAGNOSIS — N390 Urinary tract infection, site not specified: Secondary | ICD-10-CM

## 2012-03-13 DIAGNOSIS — R350 Frequency of micturition: Secondary | ICD-10-CM

## 2012-03-13 LAB — POCT URINALYSIS DIPSTICK
Glucose, UA: NEGATIVE
Ketones, UA: NEGATIVE
Spec Grav, UA: >=1.03
Urobilinogen, UA: 0.2

## 2012-03-13 MED ORDER — CIPROFLOXACIN HCL 500 MG PO TABS: 500.0000 mg | ORAL_TABLET | Freq: Two times a day (BID) | ORAL | Status: DC

## 2012-03-13 NOTE — Progress Notes (Signed)
  Subjective:    Patient ID: Emily Mcdonald, female    DOB: [birth year], 56 y.o.   MRN: [id number]  HPI Patient is a 56 yo female who presents to the clinic with symptoms of UTI. She was treated at beginning of October with UTI and did feel better. 1 week ago she started having lower abdominal pressure. 3 days days ago dysuria, low back pain and pressure. She denies any fever or GI symptoms. She has noticed some blood in her urine but not discharge. She has taken AZO and it has helped her symptoms. She did have Uterine Cancer 1 year ago and this concerns her that she might have something more wrong. She did have complete hysterectomy. She denies any Diarrhea or sexual intercourse. She can not think of any reason why she would have 2 UTI's in 2 months.   Review of Systems     Objective:   Physical Exam  Constitutional: She is oriented to person, place, and time. She appears well-developed and well-nourished.       Obese.  HENT:  Head: Normocephalic and atraumatic.  Cardiovascular: Normal rate, regular rhythm and normal heart sounds.   Pulmonary/Chest: Effort normal and breath sounds normal.       No CVA tenderness.  Abdominal: Soft. Bowel sounds are normal. She exhibits no distension. There is no tenderness.  Neurological: She is alert and oriented to person, place, and time.  Skin: Skin is warm and dry.  Psychiatric: She has a normal mood and affect. Her behavior is normal.          Assessment & Plan:  UTI/dysuria- UA positive for trace blood. Will send for culture. Since symptomatic went ahead and treated with Cipro for 7 days instead of 3 day treatment. Symptomatic care given. Call if not improving. Reassured patient that these symptoms did not seem concerning for cancer of bladder but that if she did not improve with Cipro or culture did not show bacteria that we could send to urology for cystoscopy.

## 2012-03-13 NOTE — Patient Instructions (Addendum)
Urinary Tract Infection Urinary tract infections (UTIs) can develop anywhere along your urinary tract. Your urinary tract is your body's drainage system for removing wastes and extra water. Your urinary tract includes two kidneys, two ureters, a bladder, and a urethra. Your kidneys are a pair of bean-shaped organs. Each kidney is about the size of your fist. They are located below your ribs, one on each side of your spine. CAUSES Infections are caused by microbes, which are microscopic organisms, including fungi, viruses, and bacteria. These organisms are so small that they can only be seen through a microscope. Bacteria are the microbes that most commonly cause UTIs. SYMPTOMS  Symptoms of UTIs may vary by age and gender of the patient and by the location of the infection. Symptoms in young women typically include a frequent and intense urge to urinate and a painful, burning feeling in the bladder or urethra during urination. Older women and men are more likely to be tired, shaky, and weak and have muscle aches and abdominal pain. A fever may mean the infection is in your kidneys. Other symptoms of a kidney infection include pain in your back or sides below the ribs, nausea, and vomiting. DIAGNOSIS To diagnose a UTI, your caregiver will ask you about your symptoms. Your caregiver also will ask to provide a urine sample. The urine sample will be tested for bacteria and white blood cells. White blood cells are made by your body to help fight infection. TREATMENT  Typically, UTIs can be treated with medication. Because most UTIs are caused by a bacterial infection, they usually can be treated with the use of antibiotics. The choice of antibiotic and length of treatment depend on your symptoms and the type of bacteria causing your infection. HOME CARE INSTRUCTIONS  If you were prescribed antibiotics, take them exactly as your caregiver instructs you. Finish the medication even if you feel better after you  have only taken some of the medication.  Drink enough water and fluids to keep your urine clear or pale yellow.  Avoid caffeine, tea, and carbonated beverages. They tend to irritate your bladder.  Empty your bladder often. Avoid holding urine for long periods of time.  Empty your bladder before and after sexual intercourse.  After a bowel movement, women should cleanse from front to back. Use each tissue only once. SEEK MEDICAL CARE IF:   You have back pain.  You develop a fever.  Your symptoms do not begin to resolve within 3 days. SEEK IMMEDIATE MEDICAL CARE IF:   You have severe back pain or lower abdominal pain.  You develop chills.  You have nausea or vomiting.  You have continued burning or discomfort with urination. MAKE SURE YOU:   Understand these instructions.  Will watch your condition.  Will get help right away if you are not doing well or get worse. Document Released: 02/03/2005 Document Revised: 10/26/2011 Document Reviewed: 06/04/2011 [employer] Patient Information 2013 [employer].  

## 2012-03-15 ENCOUNTER — Other Ambulatory Visit: Payer: Self-pay | Admitting: Physician Assistant

## 2012-03-15 DIAGNOSIS — R300 Dysuria: Secondary | ICD-10-CM

## 2012-03-15 DIAGNOSIS — R350 Frequency of micturition: Secondary | ICD-10-CM

## 2012-03-15 LAB — URINE CULTURE
Colony Count: NO GROWTH
Organism ID, Bacteria: NO GROWTH

## 2012-04-21 ENCOUNTER — Encounter: Payer: Self-pay | Admitting: Gynecology

## 2012-04-21 ENCOUNTER — Ambulatory Visit: Payer: BC Managed Care – PPO | Attending: Gynecology | Admitting: Gynecology

## 2012-04-21 VITALS — BP 108/62 | HR 70 | Temp 98.1°F | Resp 18 | Ht 63.78 in | Wt 214.0 lb

## 2012-04-21 DIAGNOSIS — C549 Malignant neoplasm of corpus uteri, unspecified: Secondary | ICD-10-CM | POA: Insufficient documentation

## 2012-04-21 DIAGNOSIS — Z79899 Other long term (current) drug therapy: Secondary | ICD-10-CM | POA: Insufficient documentation

## 2012-04-21 DIAGNOSIS — Z09 Encounter for follow-up examination after completed treatment for conditions other than malignant neoplasm: Secondary | ICD-10-CM | POA: Insufficient documentation

## 2012-04-21 DIAGNOSIS — Z01419 Encounter for gynecological examination (general) (routine) without abnormal findings: Secondary | ICD-10-CM | POA: Insufficient documentation

## 2012-04-21 DIAGNOSIS — E669 Obesity, unspecified: Secondary | ICD-10-CM | POA: Insufficient documentation

## 2012-04-21 DIAGNOSIS — Z9079 Acquired absence of other genital organ(s): Secondary | ICD-10-CM | POA: Insufficient documentation

## 2012-04-21 DIAGNOSIS — I10 Essential (primary) hypertension: Secondary | ICD-10-CM | POA: Insufficient documentation

## 2012-04-21 DIAGNOSIS — C541 Malignant neoplasm of endometrium: Secondary | ICD-10-CM

## 2012-04-21 DIAGNOSIS — Z90710 Acquired absence of both cervix and uterus: Secondary | ICD-10-CM | POA: Insufficient documentation

## 2012-04-21 NOTE — Patient Instructions (Addendum)
We will contact you if you're Pap smear report. Please schedule an appointment to see [place] in one year.

## 2012-04-21 NOTE — Progress Notes (Signed)
Consult Note: Gyn-Onc   Emily Mcdonald 56 y.o. female  Chief Complaint  Patient presents with  . Endo ca    Follow up    Interval History: The patient returns for continuing followup now approximately one year since initial surgery for endometrial cancer. Since her last visit she's done well she denies any GI or GU symptoms has no pelvic pain pressure vaginal bleeding or discharge. Her functional status is excellent. She is up-to-date with her mammograms. She did have a urinary tract infections past year treated with Cipro. She has no other complaints.  HPI:   Stage IA grade 1 endometrial adenocarcinoma treated with a robotic assisted hysterectomy and bilateral salpingo-vitrectomy and pelvic lymphadenectomy on 04/06/2011.   Review of Systems:10 point review of systems is negative as noted above.   Vitals: Blood pressure 108/62, pulse 70, temperature 98.1 F (36.7 C), temperature source Oral, resp. rate 18, height 5' 3.78" (1.62 m), weight 214 lb (97.07 kg), last menstrual period 10/16/2010.  Physical Exam: General : The patient is a healthy woman in no acute distress.  HEENT: normocephalic, extraoccular movements normal; neck is supple without thyromegally  Lynphnodes: Supraclavicular and inguinal nodes not enlarged  Abdomen: Soft, non-tender, no ascites, no organomegally, no masses, no hernias  Pelvic:  EGBUS: Normal female  Vagina: Normal, no lesions  Urethra and Bladder: Normal, non-tender  Cervix: Surgically absent  Uterus: Surgically absent  Bi-manual examination: Non-tender; no adenxal masses or nodularity  Rectal: normal sphincter tone, no masses, no blood  Lower extremities: No edema or varicosities. Normal range of motion    Assessment/Plan: Stage IA grade 1 endometrial carcinoma November 2012. Patient's clinically free of disease. Pap smears obtained. The patient return to see me in one year  Allergies  Allergen Reactions  . Ace Inhibitors     REACTION: cough     Past Medical History  Diagnosis Date  . Obesity   . Hypertension 03-31-11    tx. med  . Cancer 03-31-11    dx. endometrial cancer , surgery planned  . Arthritis 03-31-11    arthritis fingers, and toes more right sided    Past Surgical History  Procedure Date  . Abdominal hysterectomy 04/06/11    Robotic BSO, LND    Current Outpatient Prescriptions  Medication Sig Dispense Refill  . Ascorbic Acid (VITAMIN C) 1000 MG tablet Take 1,000 mg by mouth daily.        . calcium carbonate 200 MG capsule Take 250 mg by mouth 2 (two) times daily with a meal.        . Cyanocobalamin (VITAMIN B-12 CR PO) Take 1 tablet by mouth daily.       . fish oil-omega-3 fatty acids 1000 MG capsule Take 2 g by mouth daily.       [name] losartan (COZAAR) 100 MG tablet Take 1 tablet (100 mg total) by mouth daily.  90 tablet  1  . ciprofloxacin (CIPRO) 500 MG tablet Take 1 tablet (500 mg total) by mouth 2 (two) times daily. For 7 days.  14 tablet  0    History   Social History  . Marital Status: Legally Separated    Spouse Name: N/A    Number of Children: N/A  . 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 Smoking status: Never Smoker   . Smokeless tobacco: Never Used  . Alcohol Use: No  .  Drug Use: No  . Sexually Active: No   Other Topics Concern  . Not on file   Social History Narrative   1-2 caffeine drinks   A day.  No regular exercise.      Family History  Problem Relation Age of Onset  . Breast cancer Maternal Grandmother   . Lymphoma Paternal Grandmother 16  . Heart attack Mother 74  . Diabetes Mother     late 40s.   . Diabetes Father   . Diabetes Brother   . Hypertension Brother   . Diabetes Brother   . Hypertension Brother   . Pulmonary fibrosis Mother     passed away from this      Jeannette Corpus, MD 04/21/2012, 3:14 PM

## 2012-04-28 ENCOUNTER — Telehealth: Payer: Self-pay | Admitting: Gynecologic Oncology

## 2012-04-28 NOTE — Telephone Encounter (Signed)
Pt notified about pap results: negative.  No questions or concerns voiced. 

## 2012-05-16 ENCOUNTER — Other Ambulatory Visit (HOSPITAL_COMMUNITY)
Admission: RE | Admit: 2012-05-16 | Discharge: 2012-05-16 | Disposition: A | Discharge status: Home / Self Care | Payer: BC Managed Care – PPO | Source: Ambulatory Visit | Attending: Gynecology | Admitting: Gynecology

## 2012-06-06 ENCOUNTER — Other Ambulatory Visit: Payer: Self-pay | Admitting: Family Medicine

## 2012-09-07 ENCOUNTER — Other Ambulatory Visit: Payer: Self-pay | Admitting: Family Medicine

## 2012-09-07 NOTE — Telephone Encounter (Signed)
Needs appointment

## 2012-12-14 ENCOUNTER — Ambulatory Visit (INDEPENDENT_AMBULATORY_CARE_PROVIDER_SITE_OTHER): Payer: BC Managed Care – PPO | Admitting: Family Medicine

## 2012-12-14 ENCOUNTER — Encounter: Payer: Self-pay | Admitting: Family Medicine

## 2012-12-14 VITALS — BP 126/82 | HR 72 | Ht 63.0 in | Wt 221.0 lb

## 2012-12-14 DIAGNOSIS — I10 Essential (primary) hypertension: Secondary | ICD-10-CM

## 2012-12-14 DIAGNOSIS — Z6837 Body mass index (BMI) 37.0-37.9, adult: Secondary | ICD-10-CM

## 2012-12-14 DIAGNOSIS — Z0000 Encounter for general adult medical examination without abnormal findings: Secondary | ICD-10-CM

## 2012-12-14 LAB — COMPLETE METABOLIC PANEL WITH GFR
ALT: 14 U/L (ref 0–35)
AST: 17 U/L (ref 0–37)
CO2: 29 mEq/L (ref 19–32)
Chloride: 103 mEq/L (ref 96–112)
Creat: 0.83 mg/dL (ref 0.50–1.10)
Sodium: 140 mEq/L (ref 135–145)
Total Bilirubin: 0.7 mg/dL (ref 0.3–1.2)
Total Protein: 7.4 g/dL (ref 6.0–8.3)

## 2012-12-14 LAB — CBC WITH DIFFERENTIAL/PLATELET
Basophils Absolute: 0 10*3/uL (ref 0.0–0.1)
Basophils Relative: 1 % (ref 0–1)
Eosinophils Absolute: 0.1 10*3/uL (ref 0.0–0.7)
MCH: 27.4 pg (ref 26.0–34.0)
MCHC: 34.2 g/dL (ref 30.0–36.0)
Neutro Abs: 4.3 10*3/uL (ref 1.7–7.7)
Neutrophils Relative %: 71 % (ref 43–77)
Platelets: 209 10*3/uL (ref 150–400)
RDW: 14.2 % (ref 11.5–15.5)

## 2012-12-14 LAB — LIPID PANEL
HDL: 46 mg/dL (ref >39)
LDL Cholesterol: 97 mg/dL (ref 0–99)
Triglycerides: 181 mg/dL — ABNORMAL HIGH (ref <150)
VLDL: 36 mg/dL (ref 0–40)

## 2012-12-14 LAB — TSH: TSH: 4.277 u[IU]/mL (ref 0.350–4.500)

## 2012-12-14 MED ORDER — LOSARTAN POTASSIUM 100 MG PO TABS: ORAL_TABLET | ORAL | Status: DC

## 2012-12-14 NOTE — Progress Notes (Signed)
  Subjective:     Emily Mcdonald is a 57 y.o. female and is here for a comprehensive physical exam. The patient reports no problems.  HTN-  Pt denies chest pain, SOB, dizziness, or heart palpitations.  Taking meds as directed w/o problems.  Denies medication side effects.    Endometrial cancer-she follows regularly and with her OB/GYN. Basal to the vaginal cuff swab yearly and will for the first 5 years.   History   Social History  . Marital Status: Divorced    Spouse Name: N/A    Number of Children: N/A  . 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 Smoking status: Never Smoker   . Smokeless tobacco: Never Used  . Alcohol Use: No  . Drug Use: No  . Sexually Active: No   Other Topics Concern  . Not on file   Social History Narrative   1-2 caffeine drinks a day.  No regular exercise.     Health Maintenance  Topic Date Due  . Influenza Vaccine  01/08/2013  . Mammogram  12/13/2013  . Pap Smear  04/22/2015  . Tetanus/tdap  05/11/2015  . Colonoscopy  10/09/2015    The following portions of the patient's history were reviewed and updated as appropriate: allergies, current medications, past family history, past medical history, past social history, past surgical history and problem list.  Review of Systems A comprehensive review of systems was negative.   Objective:    LMP 10/16/2010 General appearance: alert, cooperative, appears stated age and moderately obese Head: Normocephalic, without obvious abnormality, atraumatic Eyes: conj clear, EOM, PEERLA Ears: normal TM's and external ear canals both ears Nose: Nares normal. Septum midline. Mucosa normal. No drainage or sinus tenderness. Throat: lips, mucosa, and tongue normal; teeth and gums normal Neck: no adenopathy, no carotid bruit, no JVD, supple, symmetrical, trachea midline and thyroid not enlarged, symmetric, no  tenderness/mass/nodules Back: symmetric, no curvature. ROM normal. No CVA tenderness. Lungs: clear to auscultation bilaterally Breasts: normal appearance, no masses or tenderness Heart: regular rate and rhythm, S1, S2 normal, no murmur, click, rub or gallop Abdomen: soft, non-tender; bowel sounds normal; no masses,  no organomegaly Extremities: extremities normal, atraumatic, no cyanosis or edema Pulses: 2+ and symmetric Skin: Skin color, texture, turgor normal. No rashes or lesions Lymph nodes: Cervical, supraclavicular, and axillary nodes normal. Neurologic: Grossly normal    Assessment:    Healthy female exam.      Plan:     See After Visit Summary for Counseling Recommendations  Keep up a regular exercise program and make sure you are eating a healthy diet Try to eat 4 servings of dairy a day, or if you are lactose intolerant take a calcium with vitamin D daily.  Your vaccines are up to date.  She is due for her mammogram this month.  Obesity-encouraged regular exercise. She's gained 7 pounds in the last 7 months. She met she's not working out right now.  Hypertension-does need refills on medications today.

## 2012-12-14 NOTE — Patient Instructions (Signed)
Keep up a regular exercise program and make sure you are eating a healthy diet Try to eat 4 servings of dairy a day, or if you are lactose intolerant take a calcium with vitamin D daily.  Your vaccines are up to date.   

## 2013-04-27 ENCOUNTER — Encounter (INDEPENDENT_AMBULATORY_CARE_PROVIDER_SITE_OTHER): Payer: Self-pay

## 2013-04-27 ENCOUNTER — Other Ambulatory Visit (HOSPITAL_COMMUNITY)
Admission: RE | Admit: 2013-04-27 | Discharge: 2013-04-27 | Disposition: A | Discharge status: Home / Self Care | Payer: BC Managed Care – PPO | Source: Ambulatory Visit | Attending: Gynecology | Admitting: Gynecology

## 2013-04-27 ENCOUNTER — Ambulatory Visit: Payer: BC Managed Care – PPO | Attending: Gynecology | Admitting: Gynecology

## 2013-04-27 VITALS — BP 139/89 | HR 82 | Temp 98.1°F | Resp 16 | Wt 225.1 lb

## 2013-04-27 DIAGNOSIS — Z01419 Encounter for gynecological examination (general) (routine) without abnormal findings: Secondary | ICD-10-CM | POA: Insufficient documentation

## 2013-04-27 DIAGNOSIS — I10 Essential (primary) hypertension: Secondary | ICD-10-CM | POA: Insufficient documentation

## 2013-04-27 DIAGNOSIS — Z09 Encounter for follow-up examination after completed treatment for conditions other than malignant neoplasm: Secondary | ICD-10-CM | POA: Insufficient documentation

## 2013-04-27 DIAGNOSIS — Z90710 Acquired absence of both cervix and uterus: Secondary | ICD-10-CM | POA: Insufficient documentation

## 2013-04-27 DIAGNOSIS — C541 Malignant neoplasm of endometrium: Secondary | ICD-10-CM

## 2013-04-27 DIAGNOSIS — Z8542 Personal history of malignant neoplasm of other parts of uterus: Secondary | ICD-10-CM | POA: Insufficient documentation

## 2013-04-27 DIAGNOSIS — Z9079 Acquired absence of other genital organ(s): Secondary | ICD-10-CM | POA: Insufficient documentation

## 2013-04-27 DIAGNOSIS — E669 Obesity, unspecified: Secondary | ICD-10-CM | POA: Insufficient documentation

## 2013-04-27 DIAGNOSIS — Z79899 Other long term (current) drug therapy: Secondary | ICD-10-CM | POA: Insufficient documentation

## 2013-04-27 NOTE — Progress Notes (Signed)
Consult Note: Gyn-Onc   Emily Mcdonald 57 y.o. female  Chief Complaint  Patient presents with  . Endometrial Cancer    Follow up    Assessment: Endometrial cancer no evidence disease  Plan Pap smears are obtained the patient return to see me in one year for followup.  Interval History: The patient returns for continuing followup now approximately two  years since initial surgery for endometrial cancer. Since her last visit she's done well.  she denies any GI or GU symptoms has no pelvic pain pressure vaginal bleeding or discharge. Her functional status is excellent. She is up-to-date with her mammograms.   HPI:   Stage IA grade 1 endometrial adenocarcinoma treated with a robotic assisted hysterectomy and bilateral salpingo-vitrectomy and pelvic lymphadenectomy on 04/06/2011.   Review of Systems:10 point review of systems is negative as noted above.   Vitals: Blood pressure 139/89, pulse 82, temperature 98.1 F (36.7 C), resp. rate 16, weight 225 lb 1.6 oz (102.105 kg), last menstrual period 10/16/2010.  Physical Exam: General : The patient is a healthy woman in no acute distress.  HEENT: normocephalic, extraoccular movements normal; neck is supple without thyromegally  Lynphnodes: Supraclavicular and inguinal nodes not enlarged  Abdomen: Soft, non-tender, no ascites, no organomegally, no masses, no hernias  Pelvic:  EGBUS: Normal female  Vagina: Normal, no lesions  Urethra and Bladder: Normal, non-tender  Cervix: Surgically absent  Uterus: Surgically absent  Bi-manual examination: Non-tender; no adenxal masses or nodularity  Rectal: normal sphincter tone, no masses, no blood  Lower extremities: No edema or varicosities. Normal range of motion    Assessment/Plan: Stage IA grade 1 endometrial carcinoma November 2012. Patient's clinically free of disease. Pap smears obtained. The patient return to see me in one year  Allergies  Allergen Reactions  . Ace Inhibitors    REACTION: cough    Past Medical History  Diagnosis Date  . Obesity   . Hypertension 03-31-11    tx. med  . Cancer 03-31-11    dx. endometrial cancer , surgery planned  . Arthritis 03-31-11    arthritis fingers, and toes more right sided    Past Surgical History  Procedure Laterality Date  . Abdominal hysterectomy  04/06/11    Robotic BSO, LND    Current Outpatient Prescriptions  Medication Sig Dispense Refill  . Ascorbic Acid (VITAMIN C) 1000 MG tablet Take 1,000 mg by mouth daily.        . calcium carbonate 200 MG capsule Take 250 mg by mouth 2 (two) times daily with a meal.        . Cyanocobalamin (VITAMIN B-12 CR PO) Take 1 tablet by mouth daily.       . fish oil-omega-3 fatty acids 1000 MG capsule Take 2 g by mouth daily.       [name] losartan (COZAAR) 100 MG tablet TAKE ONE TABLET BY MOUTH EVERY DAY  90 tablet  2   No current facility-administered medications for this visit.    History   Social History  . Marital Status: Divorced    Spouse Name: N/A    Number of Children: N/A  . 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 Smoking status: Never Smoker   . Smokeless tobacco: Never Used  . Alcohol Use: No  . Drug Use: No  . Sexual Activity: No   Other Topics Concern  . Not on  file   Social History Narrative   1-2 caffeine drinks a day.  No regular exercise.      Family History  Problem Relation Age of Onset  . Breast cancer Maternal Grandmother   . Lymphoma Paternal Grandmother 49  . Heart attack Mother 104  . Diabetes Mother     late 6s.   . Diabetes Father   . Diabetes Brother   . Hypertension Brother   . Diabetes Brother   . Hypertension Brother   . Pulmonary fibrosis Mother     passed away from this  . Lymphoma Brother       Jeannette Corpus, MD 04/27/2013, 2:22 PM

## 2013-04-27 NOTE — Patient Instructions (Signed)
We will contact you if the Pap smear report. Return to see us in one year. 

## 2013-04-30 ENCOUNTER — Ambulatory Visit (INDEPENDENT_AMBULATORY_CARE_PROVIDER_SITE_OTHER): Payer: BC Managed Care – PPO | Admitting: Family Medicine

## 2013-04-30 ENCOUNTER — Encounter: Payer: Self-pay | Admitting: Family Medicine

## 2013-04-30 VITALS — BP 130/76 | HR 77 | Temp 98.0°F | Wt 223.0 lb

## 2013-04-30 DIAGNOSIS — R300 Dysuria: Secondary | ICD-10-CM

## 2013-04-30 DIAGNOSIS — N390 Urinary tract infection, site not specified: Secondary | ICD-10-CM

## 2013-04-30 LAB — POCT URINALYSIS DIPSTICK
Ketones, UA: NEGATIVE
Spec Grav, UA: 1.025

## 2013-04-30 MED ORDER — CIPROFLOXACIN HCL 250 MG PO TABS: ORAL_TABLET | ORAL | Status: AC

## 2013-04-30 NOTE — Progress Notes (Signed)
CC: Emily Mcdonald is a 57 y.o. female is here for Dysuria and Abdominal Pain   Subjective: HPI: Complains of dysuria, frequency, urgency that has been present for the past week has not been getting better or worsens onset, interventions have included Azo.  Symptoms are present all hours of the day. Denies flank pain, genitourinary complaints of the above, fevers, chills, nausea     Review Of Systems Outlined In HPI  Past Medical History  Diagnosis Date  . Obesity   . Hypertension 03-31-11    tx. med  . Cancer 03-31-11    dx. endometrial cancer , surgery planned  . Arthritis 03-31-11    arthritis fingers, and toes more right sided     Family History  Problem Relation Age of Onset  . Breast cancer Maternal Grandmother   . Lymphoma Paternal Grandmother 70  . Heart attack Mother 65  . Diabetes Mother     late 69s.   . Diabetes Father   . Diabetes Brother   . Hypertension Brother   . Diabetes Brother   . Hypertension Brother   . Pulmonary fibrosis Mother     passed away from this  . Lymphoma Brother      History  Substance Use Topics  . Smoking status: Never Smoker   . Smokeless tobacco: Never Used  . Alcohol Use: No     Objective: Filed Vitals:   04/30/13 1555  BP: 130/76  Pulse: 77  Temp: 98 F (36.7 C)    Vital signs reviewed. General: Alert and Oriented, No Acute Distress HEENT: Pupils equal, round, reactive to light. Conjunctivae clear.  External ears unremarkable.  Moist mucous membranes. Lungs: Clear and comfortable work of breathing, speaking in full sentences without accessory muscle use. Cardiac: Regular rate and rhythm.  Neuro: CN II-XII grossly intact, gait normal. Extremities: No peripheral edema.  Strong peripheral pulses.  Mental Status: No depression, anxiety, nor agitation. Logical though process. Skin: Warm and dry.  Assessment & Plan: Emily Mcdonald was seen today for dysuria and abdominal pain.  Diagnoses and associated orders for this  visit:  Dysuria - Urinalysis Dipstick - Urine Culture  UTI (urinary tract infection) - ciprofloxacin (CIPRO) 250 MG tablet; Take one by mouth twice a day for five days.    Urinalysis with trace blood and leukocytes, treating suspected UTI with Cipro I will follow culture   Return if symptoms worsen or fail to improve.

## 2013-05-01 LAB — URINE CULTURE: Colony Count: 8000

## 2013-05-07 ENCOUNTER — Telehealth: Payer: Self-pay | Admitting: Gynecologic Oncology

## 2013-05-07 NOTE — Telephone Encounter (Signed)
Pt notified about pap results: negative.  No questions or concerns voiced. 

## 2013-09-11 ENCOUNTER — Other Ambulatory Visit: Payer: Self-pay | Admitting: Family Medicine

## 2013-12-06 ENCOUNTER — Encounter: Payer: Self-pay | Admitting: Family Medicine

## 2013-12-06 ENCOUNTER — Ambulatory Visit (INDEPENDENT_AMBULATORY_CARE_PROVIDER_SITE_OTHER): Payer: BC Managed Care – PPO | Admitting: Family Medicine

## 2013-12-06 VITALS — BP 132/78 | HR 62 | Wt 214.0 lb

## 2013-12-06 DIAGNOSIS — D492 Neoplasm of unspecified behavior of bone, soft tissue, and skin: Secondary | ICD-10-CM

## 2013-12-06 DIAGNOSIS — L821 Other seborrheic keratosis: Secondary | ICD-10-CM | POA: Diagnosis not present

## 2013-12-06 NOTE — Addendum Note (Signed)
Addended by: Narda Rutherford on: 12/06/2013 12:52 PM   Modules accepted: Orders

## 2013-12-06 NOTE — Progress Notes (Signed)
Subjective:    Patient ID: Emily Mcdonald, female    DOB: 1955/06/21, 58 y.o.   MRN: [id number]  HPI Lesion under left arm has changed color lately. She has been keeping ionic.  Lesion on right inner calf on right lower leg has gotten larger as well. Again she's been following it for the last year or 2 and feels it's gotten larger recently. She denies any pain itching or bleeding.   Review of Systems     Objective:   Physical Exam  Constitutional: She is oriented to person, place, and time. She appears well-developed and well-nourished.  HENT:  Head: Normocephalic and atraumatic.  Neurological: She is alert and oriented to person, place, and time.  Skin: Skin is warm and dry.  She has a approximately 1 cm seborrheic keratosis on the left inner arm near the axilla. She also has a similar lesion on the right upper outer arm. On her right right inner calf she has an approximately 4 mm purple papular lesion that is round and smooth.  Psychiatric: She has a normal mood and affect. Her behavior is normal.          Assessment & Plan:  Seborrheic keratosis - gave reassurance. Discussed him that this is a benign condition. Offered to do cryotherapy. She also has a similar lesion on her right upper arm. She would like to have this treated today.  Lesion on right inner calf on right lower leg.  - Bipsy performed.  Will call with results. Suspect dermatofibroma, but with recent change in size recommend biopsy.  Shave Biopsy Procedure Note  Pre-operative Diagnosis: Suspicious lesion  Post-operative Diagnosis: normal  Locations:right inner calf on ler leg    Indications: change in size  Anesthesia: Lidocaine 1% with epinephrine without added sodium bicarbonate  Procedure Details  History of allergy to iodine: no  Patient informed of the risks (including bleeding and infection) and benefits of the  procedure and Verbal informed consent obtained.  The lesion and surrounding area  were given a sterile prep using betadyne and draped in the usual sterile fashion. A scalpel was used to shave an area of skin approximately 37mm by 14mm.  Hemostasis achieved with alumuninum chloride. Antibiotic ointment and a sterile dressing applied.  The specimen was sent for pathologic examination. The patient tolerated the procedure well.  EBL: 0 ml  Findings: Await pathology  Condition: Stable  Complications: none.  Plan: 1. Instructed to keep the wound dry and covered for 24-48h and clean thereafter. 2. Warning signs of infection were reviewed.   3. Recommended that the patient use OTC acetaminophen as needed for pain.  4. Return PRN.   Cryotherapy Procedure Note  Pre-operative Diagnosis: Actinic keratosis  Post-operative Diagnosis: Actinic keratosis  Locations: left inner arm and right upper arm.   Indications: change in size  Anesthesia: not required    Procedure Details  Patient informed of risks (permanent scarring, infection, light or dark discoloration, bleeding, infection, weakness, numbness and recurrence of the lesion) and benefits of the procedure and verbal informed consent obtained.  The areas are treated with liquid nitrogen therapy, frozen until ice ball extended 1 mm beyond lesion, allowed to thaw, and treated again. The patient tolerated procedure well.  The patient was instructed on post-op care, warned that there may be blister formation, redness and pain. Recommend OTC analgesia as needed for pain.  Condition: Stable  Complications: none.  Plan: 1. Instructed to keep the area dry and covered for 24-48h  and clean thereafter. 2. Warning signs of infection were reviewed.   3. Recommended that the patient use OTC acetaminophen as needed for pain.  4. Return PRN.

## 2013-12-17 ENCOUNTER — Other Ambulatory Visit: Payer: Self-pay | Admitting: Family Medicine

## 2014-01-04 ENCOUNTER — Ambulatory Visit (INDEPENDENT_AMBULATORY_CARE_PROVIDER_SITE_OTHER): Payer: BC Managed Care – PPO | Admitting: Family Medicine

## 2014-01-04 ENCOUNTER — Encounter: Payer: Self-pay | Admitting: Family Medicine

## 2014-01-04 VITALS — BP 109/66 | HR 76 | Wt 218.0 lb

## 2014-01-04 DIAGNOSIS — Z1231 Encounter for screening mammogram for malignant neoplasm of breast: Secondary | ICD-10-CM

## 2014-01-04 DIAGNOSIS — Z23 Encounter for immunization: Secondary | ICD-10-CM

## 2014-01-04 DIAGNOSIS — Z0000 Encounter for general adult medical examination without abnormal findings: Secondary | ICD-10-CM | POA: Diagnosis not present

## 2014-01-04 DIAGNOSIS — Z6838 Body mass index (BMI) 38.0-38.9, adult: Secondary | ICD-10-CM

## 2014-01-04 DIAGNOSIS — E669 Obesity, unspecified: Secondary | ICD-10-CM | POA: Insufficient documentation

## 2014-01-04 NOTE — Patient Instructions (Signed)
Keep up a regular exercise program and make sure you are eating a healthy diet Try to eat 4 servings of dairy a day, or if you are lactose intolerant take a calcium with vitamin D daily.  Your vaccines are up to date.   

## 2014-01-04 NOTE — Progress Notes (Signed)
  Subjective:     Emily Mcdonald is a 58 y.o. female and is here for a comprehensive physical exam. The patient reports no problems.  History   Social History  . Marital Status: Divorced    Spouse Name: N/A    Number of Children: N/A  . 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 Smoking status: Never Smoker   . Smokeless tobacco: Never Used  . Alcohol Use: No  . Drug Use: No  . Sexual Activity: No   Other Topics Concern  . Not on file   Social History Narrative   1-2 caffeine drinks a day.  No regular exercise.     Health Maintenance  Topic Date Due  . Mammogram  12/13/2013  . Influenza Vaccine  12/08/2013  . Tetanus/tdap  05/11/2015  . Colonoscopy  10/09/2015  . Pap Smear  04/27/2016    The following portions of the patient's history were reviewed and updated as appropriate: allergies, current medications, past family history, past medical history, past social history, past surgical history and problem list.  Review of Systems A comprehensive review of systems was negative.   Objective:    BP 109/66  Pulse 76  Wt 218 lb (98.884 kg)  LMP 10/16/2010 General appearance: alert, cooperative and appears stated age Head: Normocephalic, without obvious abnormality, atraumatic Eyes: conjunctive normal., PEERLA, EOMi,  Ears: normal TM's and external ear canals both ears Nose: Nares normal. Septum midline. Mucosa normal. No drainage or sinus tenderness. Throat: lips, mucosa, and tongue normal; teeth and gums normal Neck: no adenopathy, no carotid bruit, no JVD, supple, symmetrical, trachea midline and thyroid not enlarged, symmetric, no tenderness/mass/nodules Back: symmetric, no curvature. ROM normal. No CVA tenderness. Lungs: clear to auscultation bilaterally Breasts: normal appearance, no masses or tenderness Heart: regular rate and rhythm, S1, S2 normal, no murmur, click, rub or  gallop Abdomen: soft, non-tender; bowel sounds normal; no masses,  no organomegaly Extremities: extremities normal, atraumatic, no cyanosis or edema Pulses: 2+ and symmetric Skin: Skin color, texture, turgor normal. No rashes or lesions Lymph nodes: Cervical, supraclavicular, and axillary nodes normal. Neurologic: Alert and oriented X 3, normal strength and tone. Normal symmetric reflexes. Normal coordination and gait    Assessment:    Healthy female exam.      Plan:     See After Visit Summary for Counseling Recommendations  Keep up a regular exercise program and make sure you are eating a healthy diet Try to eat 4 servings of dairy a day, or if you are lactose intolerant take a calcium with vitamin D daily.  Your vaccines are up to date.  Flu shot given today.  Due for mammogram.  Ordered today Due for CMP, lipoids, TSH  BMI 38- work on regular exercise. Encouraged her to schedule

## 2014-01-24 ENCOUNTER — Ambulatory Visit (INDEPENDENT_AMBULATORY_CARE_PROVIDER_SITE_OTHER): Payer: BC Managed Care – PPO

## 2014-01-24 DIAGNOSIS — Z1231 Encounter for screening mammogram for malignant neoplasm of breast: Secondary | ICD-10-CM

## 2014-01-25 LAB — LIPID PANEL
CHOL/HDL RATIO: 2.9 ratio
Cholesterol: 157 mg/dL (ref 0–200)
HDL: 55 mg/dL (ref >39)
LDL CALC: 83 mg/dL (ref 0–99)
Triglycerides: 97 mg/dL (ref <150)
VLDL: 19 mg/dL (ref 0–40)

## 2014-01-25 LAB — COMPLETE METABOLIC PANEL WITH GFR
ALK PHOS: 79 U/L (ref 39–117)
ALT: 15 U/L (ref 0–35)
AST: 18 U/L (ref 0–37)
Albumin: 4.4 g/dL (ref 3.5–5.2)
BUN: 17 mg/dL (ref 6–23)
CO2: 26 mEq/L (ref 19–32)
Calcium: 9.7 mg/dL (ref 8.4–10.5)
Chloride: 105 mEq/L (ref 96–112)
Creat: 0.72 mg/dL (ref 0.50–1.10)
GFR, Est African American: >89 mL/min
Glucose, Bld: 80 mg/dL (ref 70–99)
Potassium: 4.3 mEq/L (ref 3.5–5.3)
SODIUM: 138 meq/L (ref 135–145)
Total Bilirubin: 0.7 mg/dL (ref 0.2–1.2)
Total Protein: 7.1 g/dL (ref 6.0–8.3)

## 2014-01-25 LAB — TSH: TSH: 2.172 u[IU]/mL (ref 0.350–4.500)

## 2014-01-25 NOTE — Progress Notes (Signed)
Quick Note:  All labs are normal. ______ 

## 2014-03-11 ENCOUNTER — Encounter: Payer: Self-pay | Admitting: Family Medicine

## 2014-03-14 ENCOUNTER — Other Ambulatory Visit: Payer: Self-pay | Admitting: Family Medicine

## 2014-04-19 ENCOUNTER — Encounter: Payer: Self-pay | Admitting: Gynecology

## 2014-04-19 ENCOUNTER — Other Ambulatory Visit (HOSPITAL_COMMUNITY)
Admission: RE | Admit: 2014-04-19 | Discharge: 2014-04-19 | Disposition: A | Discharge status: Home / Self Care | Payer: BC Managed Care – PPO | Source: Ambulatory Visit | Attending: Gynecology | Admitting: Gynecology

## 2014-04-19 ENCOUNTER — Ambulatory Visit: Payer: BC Managed Care – PPO | Attending: Gynecology | Admitting: Gynecology

## 2014-04-19 VITALS — BP 125/74 | HR 95 | Temp 98.5°F | Resp 18 | Ht 63.0 in | Wt 213.1 lb

## 2014-04-19 DIAGNOSIS — Z79899 Other long term (current) drug therapy: Secondary | ICD-10-CM | POA: Insufficient documentation

## 2014-04-19 DIAGNOSIS — N393 Stress incontinence (female) (male): Secondary | ICD-10-CM | POA: Insufficient documentation

## 2014-04-19 DIAGNOSIS — I10 Essential (primary) hypertension: Secondary | ICD-10-CM | POA: Diagnosis not present

## 2014-04-19 DIAGNOSIS — E669 Obesity, unspecified: Secondary | ICD-10-CM | POA: Insufficient documentation

## 2014-04-19 DIAGNOSIS — C541 Malignant neoplasm of endometrium: Secondary | ICD-10-CM | POA: Insufficient documentation

## 2014-04-19 DIAGNOSIS — Z8542 Personal history of malignant neoplasm of other parts of uterus: Secondary | ICD-10-CM

## 2014-04-19 DIAGNOSIS — Z01411 Encounter for gynecological examination (general) (routine) with abnormal findings: Secondary | ICD-10-CM | POA: Insufficient documentation

## 2014-04-19 NOTE — Progress Notes (Signed)
Consult Note: Gyn-Onc   Emily Mcdonald 58 y.o. female  Chief Complaint  Patient presents with  . endometrial cancer    Assessment: Endometrial cancer (stage IA grade 1 2012) no evidence disease  Plan Pap smears are obtained the patient return to see me in one year for followup. She will contact us to seek referral to urogynecology is for surgical correction of her stress urinary incontinence in the summer.  Interval History: The patient returns for continuing followup now approximately 3  years since initial surgery for endometrial cancer. Since her last visit she's done well.  she denies any GI or GU symptoms has no pelvic pain pressure vaginal bleeding or discharge. Her functional status is excellent. She is up-to-date with her mammograms and colonoscopy.   HPI:   Stage IA grade 1 endometrial adenocarcinoma treated with a robotic assisted hysterectomy and bilateral salpingo-vitrectomy and pelvic lymphadenectomy on 04/06/2011.   Review of Systems:10 point review of systems is negative as noted above.   Vitals: Blood pressure 125/74, pulse 95, temperature 98.5 F (36.9 C), temperature source Oral, resp. rate 18, height 5\' 3"  (1.6 m), weight 213 lb 1.6 oz (96.662 kg), last menstrual period 10/16/2010.  Physical Exam: General : The patient is a healthy woman in no acute distress.  HEENT: normocephalic, extraoccular movements normal; neck is supple without thyromegally  Lynphnodes: Supraclavicular and inguinal nodes not enlarged  Abdomen: Soft, non-tender, no ascites, no organomegally, no masses, no hernias  Pelvic:  EGBUS: Normal female  Vagina: Normal, no lesions  Urethra and Bladder: Normal, non-tender  Cervix: Surgically absent  Uterus: Surgically absent  Bi-manual examination: Non-tender; no adenxal masses or nodularity  Rectal: normal sphincter tone, no masses, no blood  Lower extremities: No edema or varicosities. Normal range of motion      Allergies  Allergen Reactions   . Ace Inhibitors     REACTION: cough    Past Medical History  Diagnosis Date  . Obesity   . Hypertension 03-31-11    tx. med  . Cancer 03-31-11    dx. endometrial cancer , surgery planned  . Arthritis 03-31-11    arthritis fingers, and toes more right sided    Past Surgical History  Procedure Laterality Date  . Abdominal hysterectomy  04/06/11    Robotic BSO, LND    Current Outpatient Prescriptions  Medication Sig Dispense Refill  . AMBULATORY NON FORMULARY MEDICATION Medication Name: Viactive Calcium with D    . Cyanocobalamin (VITAMIN B-12 CR PO) Take 1 tablet by mouth daily.     . fish oil-omega-3 fatty acids 1000 MG capsule Take 2 g by mouth daily.     [name] losartan (COZAAR) 100 MG tablet TAKE ONE TABLET BY MOUTH ONCE DAILY 90 tablet 0  . Multiple Vitamin (MULTIVITAMIN) tablet Take 1 tablet by mouth daily.    . vitamin C (ASCORBIC ACID) 500 MG tablet Take 500 mg by mouth daily.    . Cholecalciferol (D3-1000 PO) Take by mouth.     No current facility-administered medications for this visit.    History   Social History  . Marital Status: Divorced    Spouse Name: N/A    Number of Children: N/A  . 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 Smoking status: Never Smoker   . Smokeless tobacco: Never Used  . Alcohol Use: No  . Drug Use: No  . Sexual  Activity: No   Other Topics Concern  . Not on file   Social History Narrative   1-2 caffeine drinks a day.  No regular exercise.      Family History  Problem Relation Age of Onset  . Breast cancer Maternal Grandmother   . Lymphoma Paternal Grandmother 5  . Heart attack Mother 87  . Diabetes Mother     late 40s.   . Diabetes Father   . Diabetes Brother   . Hypertension Brother   . Diabetes Brother   . Hypertension Brother   . Pulmonary fibrosis Mother     passed away from this  . Lymphoma Brother        Alvino Chapel, MD 04/19/2014, 3:05 PM

## 2014-04-19 NOTE — Addendum Note (Signed)
Addended by: Alla Feeling on: 04/19/2014 04:30 PM   Modules accepted: Orders

## 2014-04-19 NOTE — Patient Instructions (Signed)
Return in one year.

## 2014-04-23 LAB — CYTOLOGY - PAP

## 2014-04-26 ENCOUNTER — Telehealth: Payer: Self-pay | Admitting: Gynecologic Oncology

## 2014-04-26 NOTE — Telephone Encounter (Signed)
Message left for patient with pap smear results: negative.  Instructed to call for any questions or concerns.  

## 2014-06-19 ENCOUNTER — Other Ambulatory Visit: Payer: Self-pay | Admitting: Family Medicine

## 2014-09-17 ENCOUNTER — Other Ambulatory Visit: Payer: Self-pay | Admitting: Family Medicine

## 2014-09-18 NOTE — Telephone Encounter (Signed)
Left message for Pt informing it is time to schedule f/u appt, left callback information.

## 2014-09-30 ENCOUNTER — Ambulatory Visit (INDEPENDENT_AMBULATORY_CARE_PROVIDER_SITE_OTHER): Payer: BLUE CROSS/BLUE SHIELD | Admitting: Family Medicine

## 2014-09-30 ENCOUNTER — Encounter: Payer: Self-pay | Admitting: Family Medicine

## 2014-09-30 VITALS — BP 118/82 | HR 70 | Ht 63.0 in | Wt 222.0 lb

## 2014-09-30 DIAGNOSIS — Z23 Encounter for immunization: Secondary | ICD-10-CM

## 2014-09-30 DIAGNOSIS — Z1159 Encounter for screening for other viral diseases: Secondary | ICD-10-CM | POA: Diagnosis not present

## 2014-09-30 DIAGNOSIS — I10 Essential (primary) hypertension: Secondary | ICD-10-CM

## 2014-09-30 MED ORDER — LOSARTAN POTASSIUM 100 MG PO TABS: ORAL_TABLET | ORAL | Status: DC

## 2014-09-30 NOTE — Progress Notes (Signed)
   Subjective:    Patient ID: Emily Mcdonald, female    DOB: 02/12/1956, 59 y.o.   MRN: [id number]  HPI Hypertension- Pt denies chest pain, SOB, dizziness, or heart palpitations.  Taking meds as directed w/o problems.  Denies medication side effects.    She also has a new grandmother that'll be born in August and would like to get protection against pertussis. She had the Td vaccine in 2007.  Review of Systems     Objective:   Physical Exam  Constitutional: She is oriented to person, place, and time. She appears well-developed and well-nourished.  HENT:  Head: Normocephalic and atraumatic.  Neck: Neck supple. No thyromegaly present.  Cardiovascular: Normal rate, regular rhythm and normal heart sounds.   Pulmonary/Chest: Effort normal and breath sounds normal.  Musculoskeletal: She exhibits no edema.  Lymphadenopathy:    She has no cervical adenopathy.  Neurological: She is alert and oriented to person, place, and time.  Skin: Skin is warm and dry.  Psychiatric: She has a normal mood and affect. Her behavior is normal.          Assessment & Plan:  HTN- well controlled. F/U in 6 months. Labs lip given   Tdap given today.

## 2014-09-30 NOTE — Addendum Note (Signed)
Addended by: Teddy Spike on: 09/30/2014 05:21 PM   Modules accepted: Orders

## 2015-03-17 ENCOUNTER — Other Ambulatory Visit: Payer: Self-pay | Admitting: Family Medicine

## 2015-03-17 DIAGNOSIS — Z1231 Encounter for screening mammogram for malignant neoplasm of breast: Secondary | ICD-10-CM

## 2015-03-21 ENCOUNTER — Encounter: Payer: Self-pay | Admitting: Family Medicine

## 2015-03-21 ENCOUNTER — Ambulatory Visit (INDEPENDENT_AMBULATORY_CARE_PROVIDER_SITE_OTHER): Payer: BLUE CROSS/BLUE SHIELD | Admitting: Family Medicine

## 2015-03-21 VITALS — BP 136/65 | HR 71 | Temp 98.5°F | Resp 18 | Wt 195.3 lb

## 2015-03-21 DIAGNOSIS — I10 Essential (primary) hypertension: Secondary | ICD-10-CM | POA: Diagnosis not present

## 2015-03-21 DIAGNOSIS — Z23 Encounter for immunization: Secondary | ICD-10-CM | POA: Diagnosis not present

## 2015-03-21 MED ORDER — LOSARTAN POTASSIUM 100 MG PO TABS: ORAL_TABLET | ORAL | Status: DC

## 2015-03-21 NOTE — Progress Notes (Signed)
   Subjective:    Patient ID: Emily Mcdonald, female    DOB: [birth year], 59 y.o.   MRN: [id number]  HPI  Hypertension- Pt denies chest pain, SOB, dizziness, or heart palpitations.  Taking meds as directed w/o problems.  Denies medication side effects.     Review of Systems     Objective:   Physical Exam  Constitutional: She is oriented to person, place, and time. She appears well-developed and well-nourished.  HENT:  Head: Normocephalic and atraumatic.  Neck: Neck supple. No thyromegaly present.  Cardiovascular: Normal rate, regular rhythm and normal heart sounds.   Pulmonary/Chest: Effort normal and breath sounds normal.  Neurological: She is alert and oriented to person, place, and time.  Skin: Skin is warm and dry.  Psychiatric: She has a normal mood and affect. Her behavior is normal.          Assessment & Plan:  HTN - well controlled. Continue current. Follow-up in 6 months. Flu vaccine given. Refill sent to the pharmacy. Reminded her to go for her lab work.  She is a candidate for the shingles vaccine as well. Encouraged her to check with her insurance coverage since it is quite expensive.  Flu vaccine given.

## 2015-04-18 ENCOUNTER — Ambulatory Visit: Payer: BLUE CROSS/BLUE SHIELD | Attending: Gynecology | Admitting: Gynecology

## 2015-04-18 ENCOUNTER — Encounter: Payer: Self-pay | Admitting: Gynecology

## 2015-04-18 VITALS — BP 104/67 | HR 66 | Temp 98.4°F | Resp 20 | Ht 63.0 in | Wt 190.8 lb

## 2015-04-18 DIAGNOSIS — C541 Malignant neoplasm of endometrium: Secondary | ICD-10-CM | POA: Insufficient documentation

## 2015-04-18 DIAGNOSIS — N393 Stress incontinence (female) (male): Secondary | ICD-10-CM | POA: Diagnosis not present

## 2015-04-18 DIAGNOSIS — Z8542 Personal history of malignant neoplasm of other parts of uterus: Secondary | ICD-10-CM | POA: Diagnosis not present

## 2015-04-18 NOTE — Progress Notes (Signed)
Consult Note: Gyn-Onc   Marlene Bast 59 y.o. female  Chief Complaint  Patient presents with  . endometrial cancer    follow-up    Assessment: Endometrial cancer (stage IA grade 1 2012) no evidence disease  Plan The patient return to see me in one year for followup. She will contact us to seek referral to urogynecology is for surgical correction of her stress urinary incontinence when she is ready.  Interval History: The patient returns for continuing followup now approximately 4  years since initial surgery for endometrial cancer. Since her last visit she's done well.  she denies any GI or GU symptoms has no pelvic pain pressure vaginal bleeding or discharge. Her functional status is excellent. She is up-to-date with her mammograms and colonoscopy.   HPI:   Stage IA grade 1 endometrial adenocarcinoma treated with a robotic assisted hysterectomy and bilateral salpingo-vitrectomy and pelvic lymphadenectomy on 04/06/2011.   Review of Systems:10 point review of systems is negative as noted above.   Vitals: Blood pressure 104/67, pulse 66, temperature 98.4 F (36.9 C), temperature source Oral, resp. rate 20, height 5\' 3"  (1.6 m), weight 190 lb 12.8 oz (86.546 kg), last menstrual period 10/16/2010, SpO2 100 %.  Physical Exam: General : The patient is a healthy woman in no acute distress.  HEENT: normocephalic, extraoccular movements normal; neck is supple without thyromegally  Lynphnodes: Supraclavicular and inguinal nodes not enlarged  Abdomen: Soft, non-tender, no ascites, no organomegally, no masses, no hernias  Pelvic:  EGBUS: Normal female  Vagina: Normal, no lesions  Urethra and Bladder: Normal, non-tender  Cervix: Surgically absent  Uterus: Surgically absent  Bi-manual examination: Non-tender; no adenxal masses or nodularity  Rectal: normal sphincter tone, no masses, no blood  Lower extremities: No edema or varicosities. Normal range of motion      Allergies  Allergen  Reactions  . Ace Inhibitors     REACTION: cough    Past Medical History  Diagnosis Date  . Obesity   . Hypertension 03-31-11    tx. med  . Cancer [facility]) 03-31-11    dx. endometrial cancer , surgery planned  . Arthritis 03-31-11    arthritis fingers, and toes more right sided    Past Surgical History  Procedure Laterality Date  . Abdominal hysterectomy  04/06/11    Robotic BSO, LND    Current Outpatient Prescriptions  Medication Sig Dispense Refill  . AMBULATORY NON FORMULARY MEDICATION Medication Name: Viactive Calcium with D    . Cholecalciferol (D3-1000 PO) Take by mouth daily.     . Cyanocobalamin (VITAMIN B-12 CR PO) Take 1 tablet by mouth daily.     . fish oil-omega-3 fatty acids 1000 MG capsule Take 2 g by mouth daily.     [name] losartan (COZAAR) 100 MG tablet TAKE ONE TABLET BY MOUTH ONCE DAILY. 90 tablet 2  . Multiple Vitamin (MULTIVITAMIN) tablet Take 1 tablet by mouth daily.    . vitamin C (ASCORBIC ACID) 500 MG tablet Take 500 mg by mouth daily.     No current facility-administered medications for this visit.    Social History   Social History  . Marital Status: Divorced    Spouse Name: N/A  . Number of Children: N/A  . 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 Smoking status: Never Smoker   . Smokeless tobacco: Never Used  . Alcohol Use: No  .  Drug Use: No  . Sexual Activity: No   Other Topics Concern  . Not on file   Social History Narrative   1-2 caffeine drinks a day.  No regular exercise.      Family History  Problem Relation Age of Onset  . Breast cancer Maternal Grandmother   . Lymphoma Paternal Grandmother 33  . Heart attack Mother 69  . Diabetes Mother     late 74s.   . Diabetes Father   . Diabetes Brother   . Hypertension Brother   . Diabetes Brother   . Hypertension Brother   . Pulmonary fibrosis Mother     passed away from this  . Lymphoma Brother        Alvino Chapel, MD 04/18/2015, 2:57 PM

## 2015-04-18 NOTE — Patient Instructions (Addendum)
Return to see [place] in one year (December 2017).  Please call our office in June or July to scheduled this appointment or sooner with any questions or concerns.

## 2015-04-24 ENCOUNTER — Ambulatory Visit (INDEPENDENT_AMBULATORY_CARE_PROVIDER_SITE_OTHER): Payer: BLUE CROSS/BLUE SHIELD

## 2015-04-24 DIAGNOSIS — Z1231 Encounter for screening mammogram for malignant neoplasm of breast: Secondary | ICD-10-CM | POA: Diagnosis not present

## 2015-04-28 ENCOUNTER — Telehealth: Payer: Self-pay

## 2015-04-28 NOTE — Telephone Encounter (Signed)
Patient aware of normal mammogram results and recommendations for follow up next year.

## 2015-04-28 NOTE — Telephone Encounter (Signed)
-----   Message from [name], MD sent at 04/26/2015  8:00 PM EST ----- Please call patient. Normal mammogram.  Repeat in 1 year.

## 2015-05-08 ENCOUNTER — Other Ambulatory Visit: Payer: Self-pay | Admitting: Family Medicine

## 2015-05-08 LAB — COMPLETE METABOLIC PANEL WITH GFR
ALBUMIN: 3.9 g/dL (ref 3.6–5.1)
ALK PHOS: 56 U/L (ref 33–130)
ALT: 19 U/L (ref 6–29)
AST: 20 U/L (ref 10–35)
BILIRUBIN TOTAL: 0.4 mg/dL (ref 0.2–1.2)
BUN: 13 mg/dL (ref 7–25)
CALCIUM: 9 mg/dL (ref 8.6–10.4)
CO2: 26 mmol/L (ref 20–31)
Chloride: 105 mmol/L (ref 98–110)
Creat: 0.75 mg/dL (ref 0.50–0.99)
GFR, EST NON AFRICAN AMERICAN: 87 mL/min (ref >=60)
GFR, Est African American: >89 mL/min (ref >=60)
Glucose, Bld: 89 mg/dL (ref 65–99)
POTASSIUM: 4.8 mmol/L (ref 3.5–5.3)
Sodium: 141 mmol/L (ref 135–146)
TOTAL PROTEIN: 6.4 g/dL (ref 6.1–8.1)

## 2015-05-08 LAB — LIPID PANEL
CHOL/HDL RATIO: 3.4 ratio (ref <=5.0)
CHOLESTEROL: 154 mg/dL (ref 125–200)
HDL: 45 mg/dL — ABNORMAL LOW (ref >=46)
LDL Cholesterol: 79 mg/dL (ref <130)
Triglycerides: 151 mg/dL — ABNORMAL HIGH (ref <150)
VLDL: 30 mg/dL (ref <30)

## 2015-05-09 LAB — HEPATITIS C ANTIBODY: HCV Ab: NEGATIVE

## 2015-12-10 ENCOUNTER — Other Ambulatory Visit: Payer: Self-pay | Admitting: *Deleted

## 2015-12-10 MED ORDER — LOSARTAN POTASSIUM 100 MG PO TABS: ORAL_TABLET | ORAL | 2 refills | Status: DC

## 2016-01-05 ENCOUNTER — Ambulatory Visit (INDEPENDENT_AMBULATORY_CARE_PROVIDER_SITE_OTHER): Payer: BLUE CROSS/BLUE SHIELD | Admitting: Family Medicine

## 2016-01-05 ENCOUNTER — Encounter: Payer: Self-pay | Admitting: Family Medicine

## 2016-01-05 VITALS — BP 113/56 | HR 84 | Wt 187.0 lb

## 2016-01-05 DIAGNOSIS — I10 Essential (primary) hypertension: Secondary | ICD-10-CM | POA: Diagnosis not present

## 2016-01-05 DIAGNOSIS — Z6833 Body mass index (BMI) 33.0-33.9, adult: Secondary | ICD-10-CM | POA: Diagnosis not present

## 2016-01-05 DIAGNOSIS — Z23 Encounter for immunization: Secondary | ICD-10-CM | POA: Diagnosis not present

## 2016-01-05 DIAGNOSIS — Z1211 Encounter for screening for malignant neoplasm of colon: Secondary | ICD-10-CM

## 2016-01-05 DIAGNOSIS — E669 Obesity, unspecified: Secondary | ICD-10-CM | POA: Diagnosis not present

## 2016-01-05 NOTE — Patient Instructions (Addendum)
Take half a tab of losartan daily.

## 2016-01-05 NOTE — Progress Notes (Signed)
Subjective:    CC: HTN  HPI: Hypertension- Pt denies chest pain, SOB, dizziness, or heart palpitations.  Taking meds as directed w/o problems.  Denies medication side effects.     Obesity - She has done fantastic and has lost a proximally 35 pounds since May of last year. She has been doing a program called trim healthy mammas. It's mostly a dietary program and says it's been very easy to stick to. She's been walking some but not consistently.  Past medical history, Surgical history, Family history not pertinant except as noted below, Social history, Allergies, and medications have been entered into the medical record, reviewed, and corrections made.   Review of Systems: No fevers, chills, night sweats, weight loss, chest pain, or shortness of breath.   Objective:    General: Well Developed, well nourished, and in no acute distress.  Neuro: Alert and oriented x3, extra-ocular muscles intact, sensation grossly intact.  HEENT: Normocephalic, atraumatic  Skin: Warm and dry, no rashes. Cardiac: Regular rate and rhythm, no murmurs rubs or gallops, no lower extremity edema.  Respiratory: Clear to auscultation bilaterally. Not using accessory muscles, speaking in full sentences.   Impression and Recommendations:   HTN - BP low. Will reduce to half a tab losartan.  Continue current regimen. Follow up in  6 mo.   Obesity/BMI 33-she has done fantastic. BMI is now down to 33. Continue to work on diet and exercise. Did encourage her to try to work on getting exercise into her routine more regularly.   She is due for repeat colonoscopy. Last was in June 2007.

## 2016-01-07 LAB — BASIC METABOLIC PANEL WITH GFR
BUN: 24 mg/dL (ref 7–25)
CHLORIDE: 104 mmol/L (ref 98–110)
CO2: 24 mmol/L (ref 20–31)
Calcium: 8.8 mg/dL (ref 8.6–10.4)
Creat: 0.72 mg/dL (ref 0.50–0.99)
GFR, Est African American: >89 mL/min (ref >=60)
Glucose, Bld: 85 mg/dL (ref 65–99)
Potassium: 4.3 mmol/L (ref 3.5–5.3)
SODIUM: 137 mmol/L (ref 135–146)

## 2016-01-07 NOTE — Progress Notes (Signed)
All labs are normal. 

## 2016-05-07 ENCOUNTER — Encounter: Payer: Self-pay | Admitting: Gynecology

## 2016-05-07 ENCOUNTER — Ambulatory Visit: Payer: BLUE CROSS/BLUE SHIELD | Attending: Gynecology | Admitting: Gynecology

## 2016-05-07 VITALS — BP 142/73 | HR 65 | Temp 98.1°F | Resp 16 | Ht 63.0 in | Wt 195.6 lb

## 2016-05-07 DIAGNOSIS — I10 Essential (primary) hypertension: Secondary | ICD-10-CM | POA: Insufficient documentation

## 2016-05-07 DIAGNOSIS — C541 Malignant neoplasm of endometrium: Secondary | ICD-10-CM | POA: Insufficient documentation

## 2016-05-07 DIAGNOSIS — E669 Obesity, unspecified: Secondary | ICD-10-CM | POA: Diagnosis not present

## 2016-05-07 DIAGNOSIS — Z90710 Acquired absence of both cervix and uterus: Secondary | ICD-10-CM | POA: Diagnosis not present

## 2016-05-07 DIAGNOSIS — Z90722 Acquired absence of ovaries, bilateral: Secondary | ICD-10-CM | POA: Diagnosis not present

## 2016-05-07 DIAGNOSIS — Z8542 Personal history of malignant neoplasm of other parts of uterus: Secondary | ICD-10-CM | POA: Diagnosis not present

## 2016-05-07 NOTE — Patient Instructions (Signed)
Doing great!  Plan to follow up with Dr. Madilyn Fireman.  Please call for any questions or concerns.

## 2016-05-07 NOTE — Progress Notes (Signed)
Consult Note: Gyn-Onc   Emily Mcdonald 60 y.o. female  Chief Complaint  Patient presents with  . endometrial cancer    Assessment: Endometrial cancer (stage IA grade 1 2012) no evidence disease  Plan The patient will be released from our practice and she will return to the continuing care of her primary care physician. She will contact us to seek referral to urogynecology for surgical correction of her stress urinary incontinence when she is ready.  Interval History: The patient returns for continuing followup now approximately 5  years since initial surgery for endometrial cancer. Since her last visit she's done well.  she denies any GI symptoms, has no pelvic pain pressure vaginal bleeding or discharge. She does continue to have stress urinary incontinence but does not wish to have any surgical intervention or further evaluation. Her functional status is excellent. She is up-to-date with her mammograms and colonoscopy.   HPI:   Stage IA grade 1 endometrial adenocarcinoma treated with a robotic assisted hysterectomy and bilateral salpingo-vitrectomy and pelvic lymphadenectomy on 04/06/2011.   Review of Systems:10 point review of systems is negative as noted above.   Vitals: Blood pressure (!) 142/73, pulse 65, temperature 98.1 F (36.7 C), temperature source Oral, resp. rate 16, height 5\' 3"  (1.6 m), weight 195 lb 9.6 oz (88.7 kg), last menstrual period 10/16/2010.  Physical Exam: General : The patient is a healthy woman in no acute distress.  HEENT: normocephalic, extraoccular movements normal; neck is supple without thyromegally  Lynphnodes: Supraclavicular and inguinal nodes not enlarged  Abdomen: Soft, non-tender, no ascites, no organomegally, no masses, no hernias  Pelvic:  EGBUS: Normal female  Vagina: Normal, no lesions  Urethra and Bladder: Normal, non-tender  Cervix: Surgically absent  Uterus: Surgically absent  Bi-manual examination: Non-tender; no adenxal masses or  nodularity  Rectal: normal sphincter tone, no masses, no blood  Lower extremities: No edema or varicosities. Normal range of motion      Allergies  Allergen Reactions  . Ace Inhibitors     REACTION: cough    Past Medical History:  Diagnosis Date  . Arthritis 03-31-11   arthritis fingers, and toes more right sided  . Cancer [facility]) 03-31-11   dx. endometrial cancer , surgery planned  . Hypertension 03-31-11   tx. med  . Obesity     Past Surgical History:  Procedure Laterality Date  . ABDOMINAL HYSTERECTOMY  04/06/11   Robotic BSO, LND    Current Outpatient Prescriptions  Medication Sig Dispense Refill  . AMBULATORY NON FORMULARY MEDICATION Medication Name: Viactive Calcium with D    . Cholecalciferol (D3-1000 PO) Take by mouth daily.     . Cyanocobalamin (VITAMIN B-12 CR PO) Take 1 tablet by mouth daily.     . fish oil-omega-3 fatty acids 1000 MG capsule Take 2 g by mouth daily.     [name] losartan (COZAAR) 100 MG tablet TAKE ONE TABLET BY MOUTH ONCE DAILY. 90 tablet 2  . Multiple Vitamin (MULTIVITAMIN) tablet Take 1 tablet by mouth daily.    . vitamin C (ASCORBIC ACID) 500 MG tablet Take 500 mg by mouth daily.     No current facility-administered medications for this visit.     Social History   Social History  . Marital status: Divorced    Spouse name: N/A  . Number of children: N/A  . 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  Smoking status: Never Smoker  . Smokeless tobacco: Never Used  . Alcohol use No  . Drug use: No  . Sexual activity: No   Other Topics Concern  . Not on file   Social History Narrative   1-2 caffeine drinks a day.  No regular exercise.      Family History  Problem Relation Age of Onset  . Breast cancer Maternal Grandmother   . Lymphoma Paternal Grandmother 41  . Heart attack Mother 25  . Diabetes Mother     late 11s.   . Diabetes Father   . Diabetes  Brother   . Hypertension Brother   . Diabetes Brother   . Hypertension Brother   . Pulmonary fibrosis Mother     passed away from this  . Lymphoma Brother       Marti Sleigh, MD 05/07/2016, 1:31 PM

## 2016-09-22 ENCOUNTER — Emergency Department
Admission: EM | Admit: 2016-09-22 | Discharge: 2016-09-22 | Disposition: A | Discharge status: Home / Self Care | Payer: BLUE CROSS/BLUE SHIELD | Source: Home / Self Care | Attending: Family Medicine | Admitting: Family Medicine

## 2016-09-22 ENCOUNTER — Encounter: Payer: Self-pay | Admitting: Emergency Medicine

## 2016-09-22 ENCOUNTER — Emergency Department (INDEPENDENT_AMBULATORY_CARE_PROVIDER_SITE_OTHER): Payer: BLUE CROSS/BLUE SHIELD

## 2016-09-22 DIAGNOSIS — M76821 Posterior tibial tendinitis, right leg: Secondary | ICD-10-CM

## 2016-09-22 DIAGNOSIS — R52 Pain, unspecified: Secondary | ICD-10-CM | POA: Diagnosis not present

## 2016-09-22 DIAGNOSIS — M7731 Calcaneal spur, right foot: Secondary | ICD-10-CM

## 2016-09-22 DIAGNOSIS — M19071 Primary osteoarthritis, right ankle and foot: Secondary | ICD-10-CM

## 2016-09-22 DIAGNOSIS — M79671 Pain in right foot: Secondary | ICD-10-CM | POA: Diagnosis not present

## 2016-09-22 NOTE — Discharge Instructions (Signed)
Apply ice pack for 20 minutes 2 to 3 times daily.  Elevate.  Wear brace for about 2 to 3 weeks.  Begin range of motion and stretching exercises as per instruction sheet.  Continue Ibuprofen 200mg , 3 or 4 tabs every 8 hours with food.

## 2016-09-22 NOTE — ED Provider Notes (Signed)
[facility]    CSN: [id number] Arrival date & time: 09/22/16  1550     History   Chief Complaint Chief Complaint  Patient presents with  . Foot Pain    HPI Emily Mcdonald is a 61 y.o. female.   Patient complains of six day history of pain in her right foot that is becoming worse.  She recalls no injury or change in activities.   The history is provided by the patient.  Foot Pain  This is a new problem. Episode onset: 6 days ago. The problem occurs constantly. The problem has not changed since onset.The symptoms are aggravated by walking. Nothing relieves the symptoms. Treatments tried: ibuprofen 400mg   The treatment provided mild relief.    Past Medical History:  Diagnosis Date  . Arthritis 03-31-11   arthritis fingers, and toes more right sided  . Cancer [facility]) 03-31-11   dx. endometrial cancer , surgery planned  . Hypertension 03-31-11   tx. med  . Obesity     Patient Active Problem List   Diagnosis Date Noted  . Need for Zostavax administration 01/05/2016  . Needs flu shot 01/05/2016  . Obesity (BMI 30-39.9) 01/04/2014  . Endometrial cancer ([place]) 03/19/2011  . RESTLESS LEG SYNDROME 11/28/2007  . HYPERTENSION, BENIGN ESSENTIAL 03/17/2006    Past Surgical History:  Procedure Laterality Date  . ABDOMINAL HYSTERECTOMY  04/06/11   Robotic BSO, LND    OB History    Gravida Para Term Preterm AB Living   2 2 2     2    SAB TAB Ectopic Multiple Live Births                   Home Medications    Prior to Admission medications   Medication Sig Start Date End Date Taking? Authorizing Provider  AMBULATORY NON FORMULARY MEDICATION Medication Name: Viactive Calcium with D    [provider]  Cholecalciferol (D3-1000 PO) Take by mouth daily.     [provider]  Cyanocobalamin (VITAMIN B-12 CR PO) Take 1 tablet by mouth daily.     [provider]  fish oil-omega-3 fatty acids 1000 MG capsule Take 2 g by mouth daily.      [provider]  losartan (COZAAR) 100 MG tablet TAKE ONE TABLET BY MOUTH ONCE DAILY. 12/10/15   [name], MD  Multiple Vitamin (MULTIVITAMIN) tablet Take 1 tablet by mouth daily.    [provider]  vitamin C (ASCORBIC ACID) 500 MG tablet Take 500 mg by mouth daily.    [provider]    Family History Family History  Problem Relation Age of Onset  . Breast cancer Maternal Grandmother   . Lymphoma Paternal Grandmother 26  . Heart attack Mother 22  . Diabetes Mother        late 20s.   . Pulmonary fibrosis Mother        passed away from this  . Diabetes Father   . Diabetes Brother   . Hypertension Brother   . Diabetes Brother   . Hypertension Brother   . Lymphoma Brother     Social History Social History  Substance Use Topics  . Smoking status: Never Smoker  . Smokeless tobacco: Never Used  . Alcohol use Not on file     Allergies   Ace inhibitors   Review of Systems Review of Systems  All other systems reviewed and are negative.    Physical Exam Triage Vital Signs ED Triage  Vitals  Enc Vitals Group     BP 09/22/16 1617 134/81     Pulse Rate 09/22/16 1617 65     Resp --      Temp 09/22/16 1617 98.8 F (37.1 C)     Temp Source 09/22/16 1617 Oral     SpO2 09/22/16 1617 97 %     Weight 09/22/16 1618 203 lb (92.1 kg)     Height 09/22/16 1618 5\' 2"  (1.575 m)     Head Circumference --      Peak Flow --      Pain Score 09/22/16 1618 3     Pain Loc --      Pain Edu? --      Excl. in [place]? --    No data found.   Updated Vital Signs BP 134/81 (BP Location: Left Arm)   Pulse 65   Temp 98.8 F (37.1 C) (Oral)   Ht 5\' 2"  (1.575 m)   Wt 203 lb (92.1 kg)   LMP 10/16/2010   SpO2 97%   BMI 37.13 kg/m   Visual Acuity Right Eye Distance:   Left Eye Distance:   Bilateral Distance:    Right Eye Near:   Left Eye Near:    Bilateral Near:     Physical Exam  Constitutional: She appears well-developed and well-nourished.  No distress.  HENT:  Head: Normocephalic.  Eyes: Pupils are equal, round, and reactive to light.  Cardiovascular: Normal rate.   Pulmonary/Chest: Effort normal.  Musculoskeletal:       Right ankle: She exhibits no swelling. Tenderness.       Feet:  Right foot has tenderness to palpation over posterior tibial tendon extending into arch.  Pain elicited by resisted plantar flexion and resisted inversion of the ankle.   Neurological: She is alert.  Skin: Skin is warm and dry.  Nursing note and vitals reviewed.    UC Treatments / Results  Labs (all labs ordered are listed, but only abnormal results are displayed) Labs Reviewed - No data to display  EKG  EKG Interpretation None       Radiology Dg Foot Complete Right  Result Date: 09/22/2016 CLINICAL DATA:  61 year-old female c/o RIGHT medial foot pain x 1 week. NKI. Pain is somewhat relieved when sitting down. EXAM: RIGHT FOOT COMPLETE - 3+ VIEW COMPARISON:  None. FINDINGS: No fracture.  No bone lesion. There is joint space narrowing with prominent marginal osteophytes at the first metatarsophalangeal joint consistent with moderate to advanced osteoarthritis. Remaining joints are normally spaced and aligned with no other significant arthropathic change. Small plantar calcaneal spur. Soft tissues are unremarkable. IMPRESSION: 1. No fracture, dislocation or acute finding. 2. 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 3. Small plantar calcaneal spur. Electronically Signed   By: Lajean Manes M.D.   On: 09/22/2016 16:36    Procedures Procedures (including critical care time)  Medications Ordered in UC Medications - No data to display   Initial Impression / Assessment and Plan / UC Course  I have reviewed the triage vital signs and the nursing notes.  Pertinent labs & imaging results that were available during my care of the patient were reviewed by me and considered in my medical decision making (see chart  for details).    Dispensed AirCast stirrup splint. Apply ice pack for 20 minutes 2 to 3 times daily.  Elevate.  Wear brace for about 2 to 3 weeks.  Begin range of motion and stretching exercises as  per instruction sheet.  Continue Ibuprofen 200mg , 3 or 4 tabs every 8 hours with food.  Followup with Dr. Aundria Mems or Dr. Lynne Leader ([facility]) if not improving about two weeks.     Final Clinical Impressions(s) / UC Diagnoses   Final diagnoses:  Pain  Posterior tibial tendonitis, right    New Prescriptions New Prescriptions   No medications on file     Kandra Nicolas, MD 09/29/16 1245

## 2016-09-22 NOTE — ED Triage Notes (Signed)
Right foot pain x 1 week denies injury

## 2016-10-27 ENCOUNTER — Ambulatory Visit (INDEPENDENT_AMBULATORY_CARE_PROVIDER_SITE_OTHER): Payer: BLUE CROSS/BLUE SHIELD | Admitting: Family Medicine

## 2016-10-27 ENCOUNTER — Encounter: Payer: Self-pay | Admitting: Family Medicine

## 2016-10-27 VITALS — BP 127/70 | HR 67 | Wt 200.0 lb

## 2016-10-27 DIAGNOSIS — M79671 Pain in right foot: Secondary | ICD-10-CM

## 2016-10-27 DIAGNOSIS — M1711 Unilateral primary osteoarthritis, right knee: Secondary | ICD-10-CM | POA: Diagnosis not present

## 2016-10-27 MED ORDER — DICLOFENAC SODIUM 1 % TD GEL: 2.0000 g | Freq: Four times a day (QID) | TRANSDERMAL | 11 refills | Status: DC

## 2016-10-27 NOTE — Patient Instructions (Signed)
Thank you for coming in today. We will get a MRI.  You should hear soon about scheduling.  Apply voltaren gel.  Use the strap ankle brace.  Work on ankle motion.   Recheck a day or two after MRI.    Ankle Sprain, Phase I Rehab Ask your health care provider which exercises are safe for you. Do exercises exactly as told by your health care provider and adjust them as directed. It is normal to feel mild stretching, pulling, tightness, or discomfort as you do these exercises, but you should stop right away if you feel sudden pain or your pain gets worse.Do not begin these exercises until told by your health care provider. Stretching and range of motion exercises These exercises warm up your muscles and joints and improve the movement and flexibility of your lower leg and ankle. These exercises also help to relieve pain and stiffness. Exercise A: Gastroc and soleus stretch  1. Sit on the floor with your left / right leg extended. 2. Loop a belt or towel around the ball of your left / right foot. The ball of your foot is on the walking surface, right under your toes. 3. Keep your left / right ankle and foot relaxed and keep your knee straight while you use the belt or towel to pull your foot toward you. You should feel a gentle stretch behind your calf or knee. 4. Hold this position for __________ seconds, then release to the starting position. Repeat the exercise with your knee bent. You can put a pillow or a rolled bath towel under your knee to support it. You should feel a stretch deep in your calf or at your Achilles tendon. Repeat each stretch __________ times. Complete these stretches __________ times a day. Exercise B: Ankle alphabet  1. Sit with your left / right leg supported at the lower leg. ? Do not rest your foot on anything. ? Make sure your foot has room to move freely. 2. Think of your left / right foot as a paintbrush, and move your foot to trace each letter of the alphabet in  the air. Keep your hip and knee still while you trace. Make the letters as large as you can without feeling discomfort. 3. Trace every letter from A to Z. Repeat __________ times. Complete this exercise __________ times a day. Strengthening exercises These exercises build strength and endurance in your ankle and lower leg. Endurance is the ability to use your muscles for a long time, even after they get tired. Exercise C: Dorsiflexors  1. Secure a rubber exercise band or tube to an object, such as a table leg, that will stay still when the band is pulled. Secure the other end around your left / right foot. 2. Sit on the floor facing the object, with your left / right leg extended. The band or tube should be slightly tense when your foot is relaxed. 3. Slowly bring your foot toward you, pulling the band tighter. 4. Hold this position for __________ seconds. 5. Slowly return your foot to the starting position. Repeat __________ times. Complete this exercise __________ times a day. Exercise D: Plantar flexors  1. Sit on the floor with your left / right leg extended. 2. Loop a rubber exercise tube or band around the ball of your left / right foot. The ball of your foot is on the walking surface, right under your toes. ? Hold the ends of the band or tube in your hands. ? The  band or tube should be slightly tense when your foot is relaxed. 3. Slowly point your foot and toes downward, pushing them away from you. 4. Hold this position for __________ seconds. 5. Slowly return your foot to the starting position. Repeat __________ times. Complete this exercise __________ times a day. Exercise E: Evertors 1. Sit on the floor with your legs straight out in front of you. 2. Loop a rubber exercise band or tube around the ball of your left / right foot. The ball of your foot is on the walking surface, right under your toes. ? Hold the ends of the band in your hands, or secure the band to a stable  object. ? The band or tube should be slightly tense when your foot is relaxed. 3. Slowly push your foot outward, away from your other leg. 4. Hold this position for __________ seconds. 5. Slowly return your foot to the starting position. Repeat __________ times. Complete this exercise __________ times a day. This information is not intended to replace advice given to you by your health care provider. Make sure you discuss any questions you have with your health care provider. Document Released: 11/25/2004 Document Revised: 01/01/2016 Document Reviewed: 03/10/2015 Elsevier Interactive Patient Education  2018 [employer].

## 2016-10-27 NOTE — Progress Notes (Signed)
Subjective:    I'm seeing this patient as a consultation for: Emily Marry, MD   CC: right foot pain  HPI: Emily Mcdonald notes a 1 month history of right medial foot and ankle pain. She notes pain is been present for about a month without specific injury or change in activity. She was seen in urgent care on May 16 where x-rays of the right foot did not show any acute injuries. She feels as though her foot is inverted. The pain is worse with activity and better with rest. She denies any radiating pain weakness or numbness. She was given an Aircast type splint which she finds very uncomfortable and has not been using. She has been using an ankle wrap which does help.  Past medical history, Surgical history, Family history not pertinant except as noted below, Social history, Allergies, and medications have been entered into the medical record, reviewed, and no changes needed.   Review of Systems: No headache, visual changes, nausea, vomiting, diarrhea, constipation, dizziness, abdominal pain, skin rash, fevers, chills, night sweats, weight loss, swollen lymph nodes, body aches, joint swelling, muscle aches, chest pain, shortness of breath, mood changes, visual or auditory hallucinations.   Objective:    Vitals:   10/27/16 1355  BP: 127/70  Pulse: 67   General: Well Developed, well nourished, and in no acute distress.  Neuro/Psych: Alert and oriented x3, extra-ocular muscles intact, able to move all 4 extremities, sensation grossly intact. Skin: Warm and dry, no rashes noted.  Respiratory: Not using accessory muscles, speaking in full sentences, trachea midline.  Cardiovascular: Pulses palpable, no extremity edema. Abdomen: Does not appear distended. MSK: Right ankle no significant swelling. No erythema or induration. Patient has significant foot pronation with some subluxation medially. Mildly tender to palpation at the right medial midfoot near the navicular and medial cuneiform Foot  motion is normal Stable ligamentous exam Normal strength over patient lacks normal heel varus with toe standing  Right knee crepitations on extension  Review of x-ray left knee shows degenerative changes from 2010  Ultrasound right medial foot and ankle shows normal-appearing posterior tibialis tendon stratify hypoechoic fluid inserting onto the navicular prominence. She is most tender at the joint between the navicular and the medial cuneiform. Mild joint effusion present. Bony structures are otherwise normal.  Study Result   CLINICAL DATA:  61 year-old female c/o RIGHT medial foot pain x 1 week. NKI. Pain is somewhat relieved when sitting down.  EXAM: RIGHT FOOT COMPLETE - 3+ VIEW  COMPARISON:  None.  FINDINGS: No fracture.  No bone lesion.  There is joint space narrowing with prominent marginal osteophytes at the first metatarsophalangeal joint consistent with moderate to advanced osteoarthritis.  Remaining joints are normally spaced and aligned with no other significant arthropathic change.  Small plantar calcaneal spur.  Soft tissues are unremarkable.  IMPRESSION: 1. No fracture, dislocation or acute finding. 2. 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 3. Small plantar calcaneal spur.   Electronically Signed   By: Lajean Manes M.D.   On: 09/22/2016 16:36     No results found for this or any previous visit (from the past 24 hour(s)). No results found.  Impression and Recommendations:    Assessment and Plan: 61 y.o. female with Right foot and ankle pain. Unclear etiology. Patient has subluxation and pronation of her ankle and lack of heel varus indicating posterior tibialis tendon dysfunction. However she is most tender at the joint between the cuneiform and the navicular bone.[name]  As the etiology is uncertain and she is failing conservative management I feel that an MRI of her foot is less likely to be helpful. Plan for  diclofenac gel and general home exercises. Continue foot wrap. Recheck after MRI  Diclofenac gel can also be used 4 g per application for knee arthritis that is present on both the right and left knee on exam and with x-ray review.  Orders Placed This Encounter  Procedures  . MR FOOT RIGHT WO CONTRAST    Order Specific Question:   Reason for exam:    Answer:   eval right foot pain at Navicular and medial cuneform. ? Post tib tenoopathy    Order Specific Question:   Preferred imaging location?    Answer:   Product/process development scientist (table limit-350lbs)    Order Specific Question:   Does the patient have a pacemaker or implanted devices?    Answer:   No    Order Specific Question:   What is the patient's sedation requirement?    Answer:   No Sedation   Meds ordered this encounter  Medications  . diclofenac sodium (VOLTAREN) 1 % GEL    Sig: Apply 2 g topically 4 (four) times daily. To affected joint.    Dispense:  100 g    Refill:  11    Discussed warning signs or symptoms. Please see discharge instructions. Patient expresses understanding.

## 2016-11-08 ENCOUNTER — Ambulatory Visit (INDEPENDENT_AMBULATORY_CARE_PROVIDER_SITE_OTHER): Payer: BLUE CROSS/BLUE SHIELD

## 2016-11-08 DIAGNOSIS — M65871 Other synovitis and tenosynovitis, right ankle and foot: Secondary | ICD-10-CM | POA: Diagnosis not present

## 2016-11-08 DIAGNOSIS — M67471 Ganglion, right ankle and foot: Secondary | ICD-10-CM

## 2016-11-08 DIAGNOSIS — M19071 Primary osteoarthritis, right ankle and foot: Secondary | ICD-10-CM | POA: Diagnosis not present

## 2016-11-11 ENCOUNTER — Ambulatory Visit (INDEPENDENT_AMBULATORY_CARE_PROVIDER_SITE_OTHER): Payer: BLUE CROSS/BLUE SHIELD | Admitting: Family Medicine

## 2016-11-11 VITALS — BP 138/64 | HR 69 | Temp 98.1°F | Wt 200.0 lb

## 2016-11-11 DIAGNOSIS — M79671 Pain in right foot: Secondary | ICD-10-CM

## 2016-11-11 DIAGNOSIS — Z1211 Encounter for screening for malignant neoplasm of colon: Secondary | ICD-10-CM

## 2016-11-11 DIAGNOSIS — I10 Essential (primary) hypertension: Secondary | ICD-10-CM | POA: Diagnosis not present

## 2016-11-11 MED ORDER — LOSARTAN POTASSIUM 100 MG PO TABS: ORAL_TABLET | ORAL | 2 refills | Status: DC

## 2016-11-11 NOTE — Patient Instructions (Addendum)
Thank you for coming in today. Use the cam walker when walking.  Do not drive with the cast boot on your foot.  Recheck in 1 month.   Get fasting labs tomorrow.  You should hear from [facility] soon about Colonscopy.   Follow up with Dr Madilyn Fireman in 3-6 months.

## 2016-11-11 NOTE — Progress Notes (Signed)
Emily Mcdonald is a 61 y.o. female who presents to [facility] today for right foot and ankle pain. Patient was seen in June 20 for right foot and ankle pain. An MRI was obtained which showed DJD of the navicular talus joint and subtalar joint with possible stress fracture of the talar neck. Additionally she had evidence of tenosynovitis of the posterior tibialis tendons and a ganglion cyst dorsal to the cuneiform bone.   She notes continued pain along the medial aspect of her ankle on course of the posterior tibialis tendon. Additionally she has some pain on the dorsal midfoot.   Past Medical History:  Diagnosis Date  . Arthritis 03-31-11   arthritis fingers, and toes more right sided  . Cancer [facility]) 03-31-11   dx. endometrial cancer , surgery planned  . Hypertension 03-31-11   tx. med  . Obesity    Past Surgical History:  Procedure Laterality Date  . ABDOMINAL HYSTERECTOMY  04/06/11   Robotic BSO, LND   Social History  Substance Use Topics  . Smoking status: Never Smoker  . Smokeless tobacco: Never Used  . Alcohol use Not on file   family history includes Breast cancer in her maternal grandmother; Diabetes in her brother, brother, father, and mother; Heart attack (age of onset: 22) in her mother; Hypertension in her brother and brother; Lymphoma in her brother; Lymphoma (age of onset: 44) in her paternal grandmother; Pulmonary fibrosis in her mother.  ROS as above:  Medications: Current Outpatient Prescriptions  Medication Sig Dispense Refill  . AMBULATORY NON FORMULARY MEDICATION Medication Name: Viactive Calcium with D    . Cholecalciferol (D3-1000 PO) Take by mouth daily.     . Cyanocobalamin (VITAMIN B-12 CR PO) Take 1 tablet by mouth daily.     . diclofenac sodium (VOLTAREN) 1 % GEL Apply 2 g topically 4 (four) times daily. To affected joint. 100 g 11    . fish oil-omega-3 fatty acids 1000 MG capsule Take 2 g by mouth daily.     [name] losartan (COZAAR) 100 MG tablet TAKE ONE TABLET BY MOUTH ONCE DAILY. 90 tablet 2  . Multiple Vitamin (MULTIVITAMIN) tablet Take 1 tablet by mouth daily.    . vitamin C (ASCORBIC ACID) 500 MG tablet Take 500 mg by mouth daily.     No current facility-administered medications for this visit.    Allergies  Allergen Reactions  . Ace Inhibitors     REACTION: cough    Health Maintenance Health Maintenance  Topic Date Due  . HIV Screening  02/21/1971  . COLONOSCOPY  10/09/2015  . INFLUENZA VACCINE  12/08/2016  . PAP SMEAR  04/19/2017  . MAMMOGRAM  04/23/2017  . TETANUS/TDAP  09/29/2024  . Hepatitis C Screening  Completed     Exam:  BP 138/64 (BP Location: Left Arm, Patient Position: Sitting, Cuff Size: Normal)   Pulse 69   Temp 98.1 F (36.7 C) (Oral)   Wt 200 lb (90.7 kg)   LMP 10/16/2010   SpO2 98%   BMI 36.58 kg/m    Gen: Well NAD Right foot well appearing tender to palpation along the course of the posterior tibialis tendon at the medial malleolus. Mildly tender to palpation across the dorsal midfoot. Pulses capillary refill and sensation are intact.   No results found for this or any previous visit (from the past 72 hour(s)). Mr Foot Right Wo Contrast  Result Date: 11/08/2016 CLINICAL DATA:  Pain and swelling of the medial right ankle x2 months without known injury. EXAM: MRI OF THE RIGHT FOREFOOT WITHOUT CONTRAST TECHNIQUE: Multiplanar, multisequence MR imaging of the right ankle was performed. No intravenous contrast was administered. COMPARISON:  None. FINDINGS: Bones/Joint/Cartilage Marrow signal abnormalities across the talonavicular and subtalar joints involving the anterior talus consistent with probable reactive edema. Degenerative joint space narrowing is noted across the talonavicular joint with dorsal spurring with slight joint space narrowing across the subtalar joint is well  anteriorly. There are small dorsal ganglion cysts adjacent to the navicular - cuneiform articulations. As there are faint linear hypointense signal abnormalities within the area of marrow edema, subtle stress fractures are not entirely excluded. Plantar calcaneal enthesophyte noted. Ligaments Intact Muscles and Tendons Fluid outlines the posterior tibialis tendon suspicious for tenosynovitis. A small amount of fluid also outlines the flexor hallucis and flexor digitorum tendons. The extensor and peroneal tendons crossing the ankle joint are unremarkable. Soft tissues Mild soft tissue swelling along the medial aspect of the midfoot IMPRESSION: 1. Marrow edema of the anterior talus with osteoarthritic joint space narrowing of the talonavicular and subtalar joints. Findings likely reactive in etiology. As there are faint hypointense linear foci within the area of affected talus, changes of a subtle stress fracture not entirely excluded. 2. Tenosynovitis of the medial tendons crossing ankle joint. 3. Subcutaneous soft tissue edema along medial aspect of the midfoot. 4. Small dorsal ganglion cysts across the navicular - cuneiform articulations. Electronically Signed   By: Ashley Royalty M.D.   On: 11/08/2016 16:54      Assessment and Plan: 61 y.o. female with multifactorial foot pain.   The pain along the posterior tibialis tendon is consistent with posterior tibialis tendinitis. I think this is secondary to her mid foot pain as a result of limping. Will treat this along with the midfoot pain with a cam walker boot.  The midfoot pain I think is probably just DJD. Although the MRI is suggestive of a potential stress fracture. I think we have to respect this finding and treat conservatively with a cam walker boot. We'll recheck in a month.  Lastly she has some dorsal ganglion cyst that I don't think are really symptomatic at all. We'll continue to follow.  Additionally patient is here for blood pressure refill.  Blood pressure is well managed. I discussed the case with her PCP. Plan to continue losartan 100 mg daily. We'll check fasting labs tomorrow. Follow-up with PCP in 3-6 months.  Health maintenance: Patient is due for colon cancer screening. Referral back to digestive health specialist ordered  Orders Placed This Encounter  Procedures  . CBC  . COMPLETE METABOLIC PANEL WITH GFR  . Lipid Panel w/reflex Direct LDL  . Ambulatory referral to Gastroenterology    Referral Priority:   Routine    Referral Type:   Consultation    Referral Reason:   Specialty Services Required    Number of Visits Requested:   1   Meds ordered this encounter  Medications  . losartan (COZAAR) 100 MG tablet    Sig: TAKE ONE TABLET BY MOUTH ONCE DAILY.    Dispense:  90 tablet    Refill:  2     Discussed warning signs or symptoms. Please see discharge instructions. Patient expresses understanding.

## 2016-11-12 ENCOUNTER — Ambulatory Visit: Payer: BLUE CROSS/BLUE SHIELD | Admitting: Family Medicine

## 2016-11-12 ENCOUNTER — Telehealth: Payer: Self-pay

## 2016-11-12 LAB — CBC
HEMATOCRIT: 40.6 % (ref 35.0–45.0)
HEMOGLOBIN: 13.5 g/dL (ref 11.7–15.5)
MCH: 28.2 pg (ref 27.0–33.0)
MCHC: 33.3 g/dL (ref 32.0–36.0)
MCV: 84.9 fL (ref 80.0–100.0)
MPV: 9.3 fL (ref 7.5–12.5)
Platelets: 166 10*3/uL (ref 140–400)
RBC: 4.78 MIL/uL (ref 3.80–5.10)
RDW: 13.9 % (ref 11.0–15.0)
WBC: 4.3 10*3/uL (ref 3.8–10.8)

## 2016-11-12 LAB — COMPLETE METABOLIC PANEL WITH GFR
ALBUMIN: 4.2 g/dL (ref 3.6–5.1)
ALK PHOS: 68 U/L (ref 33–130)
ALT: 14 U/L (ref 6–29)
AST: 18 U/L (ref 10–35)
BUN: 18 mg/dL (ref 7–25)
CALCIUM: 9.2 mg/dL (ref 8.6–10.4)
CHLORIDE: 106 mmol/L (ref 98–110)
CO2: 24 mmol/L (ref 20–31)
Creat: 0.81 mg/dL (ref 0.50–0.99)
GFR, EST NON AFRICAN AMERICAN: 79 mL/min (ref >=60)
GFR, Est African American: >89 mL/min (ref >=60)
Glucose, Bld: 79 mg/dL (ref 65–99)
POTASSIUM: 4.2 mmol/L (ref 3.5–5.3)
SODIUM: 140 mmol/L (ref 135–146)
Total Bilirubin: 0.7 mg/dL (ref 0.2–1.2)
Total Protein: 6.8 g/dL (ref 6.1–8.1)

## 2016-11-12 LAB — LIPID PANEL W/REFLEX DIRECT LDL
CHOL/HDL RATIO: 3.1 ratio (ref <5.0)
CHOLESTEROL: 168 mg/dL (ref <200)
HDL: 55 mg/dL (ref >50)
LDL-Cholesterol: 96 mg/dL
NON-HDL CHOLESTEROL (CALC): 113 mg/dL (ref <130)
TRIGLYCERIDES: 78 mg/dL (ref <150)

## 2016-11-12 NOTE — Telephone Encounter (Signed)
PA for diclofenac was denied.  ID: [id number] Ref #: Berline Chough

## 2016-11-15 NOTE — Telephone Encounter (Signed)
Please inform patient that she can pay out of pocket for this with a coupon for good Rx to make it affordable

## 2016-11-16 NOTE — Telephone Encounter (Signed)
Recommendations left on vm -EH/RMA  

## 2016-12-02 DIAGNOSIS — Z1211 Encounter for screening for malignant neoplasm of colon: Secondary | ICD-10-CM | POA: Diagnosis not present

## 2016-12-02 DIAGNOSIS — K5730 Diverticulosis of large intestine without perforation or abscess without bleeding: Secondary | ICD-10-CM | POA: Diagnosis not present

## 2016-12-02 LAB — HM COLONOSCOPY

## 2016-12-13 ENCOUNTER — Encounter: Payer: Self-pay | Admitting: Family Medicine

## 2016-12-13 ENCOUNTER — Ambulatory Visit (INDEPENDENT_AMBULATORY_CARE_PROVIDER_SITE_OTHER): Payer: BLUE CROSS/BLUE SHIELD | Admitting: Family Medicine

## 2016-12-13 VITALS — BP 133/87 | HR 61 | Ht 63.0 in | Wt 207.0 lb

## 2016-12-13 DIAGNOSIS — M79671 Pain in right foot: Secondary | ICD-10-CM

## 2016-12-13 NOTE — Progress Notes (Signed)
Emily Mcdonald is a 61 y.o. female who presents to [facility] today for follow-up of right foot pain.  Patient was seen on July 5 for posterior tibialis tendinitis and potential stress fracture. Patient was treated with a cam walker boot. She states that she does not have significant pain while wearing the boot. Patient has not been able to decrease time spent on her feet due to watching a 62-year-old at home. She was also given a prescription for Volteran gel, but was unable to use this due to her insurance not covering it. Patient notes that swelling has improved since her last visit, but is still present. Patient denies any numbness or tingling in her feet.  Patient denies any fevers, chills, weight loss, abdominal pain, chest pain, shortness of breath, changes in bowel movements or changes in urination.   Past Medical History:  Diagnosis Date  . Arthritis 03-31-11   arthritis fingers, and toes more right sided  . Cancer [facility]) 03-31-11   dx. endometrial cancer , surgery planned  . Hypertension 03-31-11   tx. med  . Obesity    Past Surgical History:  Procedure Laterality Date  . ABDOMINAL HYSTERECTOMY  04/06/11   Robotic BSO, LND   Social History  Substance Use Topics  . Smoking status: Never Smoker  . Smokeless tobacco: Never Used  . Alcohol use Not on file     ROS:  As above   Medications: Current Outpatient Prescriptions  Medication Sig Dispense Refill  . AMBULATORY NON FORMULARY MEDICATION Medication Name: Viactive Calcium with D    . Cholecalciferol (D3-1000 PO) Take by mouth daily.     . Cyanocobalamin (VITAMIN B-12 CR PO) Take 1 tablet by mouth daily.     . diclofenac sodium (VOLTAREN) 1 % GEL Apply 2 g topically 4 (four) times daily. To affected joint. 100 g 11  . fish oil-omega-3 fatty acids 1000 MG capsule Take 2 g by mouth daily.     [name] losartan (COZAAR) 100 MG tablet TAKE ONE TABLET BY MOUTH ONCE DAILY. 90 tablet 2   . Multiple Vitamin (MULTIVITAMIN) tablet Take 1 tablet by mouth daily.    . vitamin C (ASCORBIC ACID) 500 MG tablet Take 500 mg by mouth daily.     No current facility-administered medications for this visit.    Allergies  Allergen Reactions  . Ace Inhibitors     REACTION: cough     Exam:  BP 133/87   Pulse 61   Ht 5\' 3"  (1.6 m)   Wt 207 lb (93.9 kg)   LMP 10/16/2010   BMI 36.67 kg/m  General: Well Developed, well nourished, and in no acute distress.  Neuro/Psych: Alert and oriented x3, extra-ocular muscles intact, able to move all 4 extremities, sensation grossly intact. Skin: Warm and dry, no rashes noted.  Respiratory: Not using accessory muscles, speaking in full sentences, trachea midline.  Cardiovascular: Pulses palpable, no extremity edema. Abdomen: Does not appear distended. MSK: Right foot: No gross deformity on inspection, mild swelling at medial aspect of foot Tenderness to palpation at talonavicular joint Range of motion is full with plantarflexion, dorsiflexion and eversion, normal range of motion with inversion but this elicits pain Strength is 5/5 with plantarflexion and dorsiflexion   No results found for this or any previous visit (from the past 48 hour(s)). No results found.    Assessment and Plan: 61 y.o. female with right foot pain. Patient has improved with using cam walker boot.  She should continue wearing the boot on days with increased activity. The benefits of PT were also discussed with the patient. It was recommended that she attend a few sessions or at least once per week to learn the exercises that will be most beneficial to her. Given patient's improvement, a steroid injection is not indicated at this time. This could slow healing of potential stress fracture.  Plan to wean out of boot over the next 2 weeks while attending physical therapy. Patient will follow-up in 4-6 weeks, at which time the stress fracture should be healed and the need for  a steroid injection can be reassessed.     Orders Placed This Encounter  Procedures  . Ambulatory referral to Physical Therapy    Referral Priority:   Routine    Referral Type:   Physical Medicine    Referral Reason:   Specialty Services Required    Requested Specialty:   Physical Therapy   No orders of the defined types were placed in this encounter.   Discussed warning signs or symptoms. Please see discharge instructions. Patient expresses understanding.

## 2016-12-13 NOTE — Patient Instructions (Signed)
Thank you for coming in today. Continue the boot with activity for 2-4 weeks.  Use it especially when doing lots of walking or standing.  Attend PT.  Recheck with me in 4-6 weeks or sooner if needed.

## 2016-12-30 ENCOUNTER — Ambulatory Visit (INDEPENDENT_AMBULATORY_CARE_PROVIDER_SITE_OTHER): Payer: BLUE CROSS/BLUE SHIELD | Admitting: Physical Therapy

## 2016-12-30 ENCOUNTER — Encounter: Payer: Self-pay | Admitting: Physical Therapy

## 2016-12-30 DIAGNOSIS — M25571 Pain in right ankle and joints of right foot: Secondary | ICD-10-CM

## 2016-12-30 DIAGNOSIS — R2689 Other abnormalities of gait and mobility: Secondary | ICD-10-CM

## 2016-12-30 DIAGNOSIS — M6281 Muscle weakness (generalized): Secondary | ICD-10-CM | POA: Diagnosis not present

## 2016-12-30 NOTE — Therapy (Signed)
[facility]  [place], [place], [address] Phone: [phone]   Fax:  [phone]  Physical Therapy Evaluation  Patient Details  Name: Emily Mcdonald MRN: [id number] Date of Birth: May 12, 1955 Referring Provider: Dr Steva Colder  Encounter Date: 12/30/2016      PT End of Session - 12/30/16 1654    Visit Number 1   Number of Visits 6   Date for PT Re-Evaluation 02/10/17   PT Start Time 4128   PT Stop Time 1800   PT Time Calculation (min) 66 min   Activity Tolerance Patient tolerated treatment well      Past Medical History:  Diagnosis Date  . Arthritis 03-31-11   arthritis fingers, and toes more right sided  . Cancer [facility]) 03-31-11   dx. endometrial cancer , surgery planned  . Hypertension 03-31-11   tx. med  . Obesity     Past Surgical History:  Procedure Laterality Date  . ABDOMINAL HYSTERECTOMY  04/06/11   Robotic BSO, LND    There were no vitals filed for this visit.       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    How long can you sit comfortably? no limiations   How long can you walk comfortably? without boot in house only no real pain, in community wears the boot and will occassionally have pain on steps.    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    Currently in Pain? No/denies  no pain at rest.    Pain Orientation Right  medially below malleoli   Aggravating Factors  walking up/down steps   Pain Relieving Factors rest            [facility] PT Assessment -  12/30/16 0001      Assessment   Medical Diagnosis Rt post tib tendonosis, questionable talar stress fx   Referring Provider Dr Steva Colder   Onset Date/Surgical Date 09/12/16   Hand Dominance Right   Next MD Visit after therapy   Prior Therapy none     Precautions   Precautions None   Required Braces or Orthoses --  CAM boot 1 more week when at school     Balance Screen   Has the patient fallen in the past 6 months No     [place] residence   Living Arrangements Spouse/significant other   Home Layout One level     Prior Function   Level of Independence Independent   Vocation Full time employment   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   Leisure play with granchildren, read     Observation/Other Assessments   Focus on Therapeutic Outcomes (FOTO)  43% limited     Observation/Other Assessments-Edema    Edema Figure 8     Circumferential Edema   Circumferential - Right 25.9 distal malleoli   Circumferential - Left  24.0 distal malleoli     Figure 8 Edema  Figure 8 - Right  50.4cm    Figure 8 - Left  49.2     Functional Tests   Functional tests Squat;Single leg stance     Squat   Comments slight wt shift to Rt      Single Leg Stance   Comments Lt > 10 sec, Rt 2 sec with alot of accessory motion      Posture/Postural Control   Posture/Postural Control Postural limitations   Postural Limitations --  wt shift to Rt, pes planus, (+) edema Rt ankle collasped dow     ROM / Strength   AROM / PROM / Strength AROM;Strength;PROM     AROM   AROM Assessment Site Ankle   Right/Left Ankle Left;Right   Right Ankle Dorsiflexion 14   Right Ankle Plantar Flexion 66   Right Ankle Inversion 49   Right Ankle Eversion 20   Left Ankle Dorsiflexion 10   Left Ankle Plantar Flexion 62   Left Ankle Inversion 45   Left Ankle Eversion 14     PROM   Overall PROM Comments Great toe extension 49, Lt 73   PROM Assessment  Site Ankle   Right/Left Ankle Right  Lt eversion 26   Right Ankle Dorsiflexion 18   Right Ankle Eversion 24     Strength   Overall Strength Comments Rt foot intrinstic muscle weakness    Strength Assessment Site Hip;Knee;Ankle   Right/Left Hip --  WNL   Right/Left Knee --  bilat WNL   Right/Left Ankle Right  Lt WNL   Right Ankle Dorsiflexion --  5-/5   Right Ankle Plantar Flexion 4-/5   Right Ankle Inversion 4-/5  with pain   Right Ankle Eversion --  5-/5     Palpation   Palpation comment tender at base of medial malleoli            Objective measurements completed on examination: See above findings.          [facility] Adult PT Treatment/Exercise - 12/30/16 0001      Exercises   Exercises Ankle     Modalities   Modalities Vasopneumatic;Iontophoresis     Iontophoresis   Type of Iontophoresis Dexamethasone   Location medial Rt ankle   Dose 1.0 cc   Time 195mA patch     Vasopneumatic   Number Minutes Vasopneumatic  15 minutes   Vasopnuematic Location  Ankle  Rt    Vasopneumatic Pressure Medium   Vasopneumatic Temperature  3*     Ankle Exercises: Standing   SLS Rt with left toe taps FWD/side/BWD x 10 reps PRN hold onto counter     Ankle Exercises: Seated   Towel Crunch --  10 reps Rt    Other Seated Ankle Exercises 10 reps ankle inversion/eversion with red band                PT Education - 12/30/16 1745    Education provided Yes   Education Details HEP and ionto   Person(s) Educated Patient   Methods Explanation;Demonstration;Handout   Comprehension Returned demonstration;Verbalized understanding             PT Long Term Goals - 12/30/16 1755      PT LONG TERM GOAL #1   Title I with advanced HEP ( 02/10/17)    Time 6   Period Weeks   Status New     PT LONG TERM GOAL #2   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 with no more  than 1/10 pain in her Rt ankle ( 02/10/17)    Time 6   Period Weeks   Status New      PT LONG TERM GOAL #3   Title demo Rt ankle strength =/> 5-/5 ( 02/10/17)    Time 6   Period Weeks   Status New     PT LONG TERM GOAL #4   Title perform Rt SLS =/> 15 sec ( 02/10/17)    Time 6   Period Weeks   Status New     PT LONG TERM GOAL #5   Title improve FOTO =/< 30% limited ( 02/10/17)    Time 6   Period Weeks   Status New                Plan - 12/30/16 1751    Clinical Impression Statement 62 yo female presents 3.5 months s/p Rt ankle pain.  She had an MRI and it showed tendonosis in the posterior tib and questionable stress fx of the talus, She wore a boot for ~ 6 weeks and has another to go.  Overall her motion in good, she has weakness in the Rt ankle and is still wearing the boot for ambulation outside of the house. Her proprioception system is impaired as well.    Clinical Presentation Stable   Clinical Decision Making Low   Rehab Potential Excellent   PT Frequency 1x / week   PT Duration 6 weeks   PT Treatment/Interventions Moist Heat;Ultrasound;Therapeutic exercise;Dry needling;Taping;Vasopneumatic Device;Manual techniques;Neuromuscular re-education;Cryotherapy;Electrical Stimulation;Iontophoresis 4mg /ml Dexamethasone;Patient/family education;Passive range of motion   PT Next Visit Plan [place] to medial ankle, ionto, proprioception    Recommended Other Services Patient would benefit from custom orthotics to support her feet.    Consulted and Agree with Plan of Care Patient      Patient will benefit from skilled therapeutic intervention in order to improve the following deficits and impairments:  Decreased range of motion, Pain, Decreased strength, Increased edema  Visit Diagnosis: Pain in right ankle and joints of right foot - Plan: PT plan of care cert/re-cert  Muscle weakness (generalized) - Plan: PT plan of care cert/re-cert  Other abnormalities of gait and mobility - Plan: PT plan of care cert/re-cert     Problem List Patient Active Problem List    Diagnosis Date Noted  . Foot pain, right 11/11/2016  . Arthritis of right knee 10/27/2016  . Need for Zostavax administration 01/05/2016  . Needs flu shot 01/05/2016  . Obesity (BMI 30-39.9) 01/04/2014  . Endometrial cancer ([place]) 03/19/2011  . RESTLESS LEG SYNDROME 11/28/2007  . HYPERTENSION, BENIGN ESSENTIAL 03/17/2006    Jeral Pinch PT  12/30/2016, 6:00 PM  [facility] [place] [address] [address], [place], [address] Phone: [phone]   Fax:  [phone]  Name: Emily Mcdonald MRN: [id number] Date of Birth: 12-28-1955

## 2016-12-30 NOTE — Patient Instructions (Addendum)
Balance: Three-Way Leg Swing    Stand on left foot, hands on hips. Reach other foot forward __1__ times, sideways ___1_ times, back __1__ times. Hold each position _1___ seconds. Relax. Repeat _10___ times per set. Do _2___ sets per session. Do __1__ sessions per day.  Toe Curl: Unilateral    With right foot resting on towel, slowly bunch up towel by curling toes. Repeat ___10_ times per set. Do _1___ sets per session. Do _1___ sessions per day.  Inversion: Resisted - perform in a seated position to stabilize leg.  SIT LIKE A MAN with Rt ankle on left knee, lift toes up quickly and lower slowly and with control.     Cross legs with right leg underneath, foot in tubing loop. Hold tubing around other foot to resist and turn foot in. Repeat __10__ times per set. Do __2-3__ sets per session. Do _1___ sessions per day.  Eversion: Resisted - perform in a seated position feet flat on floor.     With right foot in tubing loop, hold tubing around other foot to resist and turn foot out. Repeat __10__ times per set. Do __2-3__ sets per session. Do __1__ sessions per day.  IONTOPHORESIS PATIENT PRECAUTIONS & CONTRAINDICATIONS:  . Redness under one or both electrodes can occur.  This characterized by a uniform redness that usually disappears within 12 hours of treatment. . Small pinhead size blisters may result in response to the drug.  Contact your physician if the problem persists more than 24 hours. . On rare occasions, iontophoresis therapy can result in temporary skin reactions such as rash, inflammation, irritation or burns.  The skin reactions may be the result of individual sensitivity to the ionic solution used, the condition of the skin at the start of treatment, reaction to the materials in the electrodes, allergies or sensitivity to dexamethasone, or a poor connection between the patch and your skin.  Discontinue using iontophoresis if you have any of these reactions and report to your  therapist. . Remove the Patch or electrodes if you have any undue sensation of pain or burning during the treatment and report discomfort to your therapist. . Tell your Therapist if you have had known adverse reactions to the application of electrical current. . If using the Patch, the LED light will turn off when treatment is complete and the patch can be removed.  Approximate treatment time is 1-3 hours.  Remove the patch when light goes off or after 6 hours. . The Patch can be worn during normal activity, however excessive motion where the electrodes have been placed can cause poor contact between the skin and the electrode or uneven electrical current resulting in greater risk of skin irritation. [name] Keep out of the reach of children.   . DO NOT use if you have a cardiac pacemaker or any other electrically sensitive implanted device. . DO NOT use if you have a known sensitivity to dexamethasone. . DO NOT use during Magnetic Resonance Imaging (MRI). . DO NOT use over broken or compromised skin (e.g. sunburn, cuts, or acne) due to the increased risk of skin reaction. . DO NOT SHAVE over the area to be treated:  To establish good contact between the Patch and the skin, excessive hair may be clipped. . DO NOT place the Patch or electrodes on or over your eyes, directly over your heart, or brain. . DO NOT reuse the Patch or electrodes as this may cause burns to occur.

## 2017-01-13 ENCOUNTER — Encounter: Payer: Self-pay | Admitting: Physical Therapy

## 2017-01-13 ENCOUNTER — Ambulatory Visit (INDEPENDENT_AMBULATORY_CARE_PROVIDER_SITE_OTHER): Payer: BLUE CROSS/BLUE SHIELD | Admitting: Physical Therapy

## 2017-01-13 DIAGNOSIS — M25571 Pain in right ankle and joints of right foot: Secondary | ICD-10-CM

## 2017-01-13 DIAGNOSIS — R2689 Other abnormalities of gait and mobility: Secondary | ICD-10-CM

## 2017-01-13 DIAGNOSIS — M6281 Muscle weakness (generalized): Secondary | ICD-10-CM | POA: Diagnosis not present

## 2017-01-13 NOTE — Patient Instructions (Addendum)
Heel Raise: Bilateral (Standing)    Rise on balls of feet. One set each, toes straight, turned out, turned in.  ( work in pain free motion with your toe)  Repeat _10___ times per set. Do _1___ sets per session. Do _4___ sessions per week.  FUNCTIONAL MOBILITY: Heel Walking    Walk forward on heels.  __5_ reps per set, _30__ secs each, _4__ days per week.   Balance: Unilateral - Forward Lean   Stand on tight foot, hands on hips. Keeping hips level, bend forward as if to touch forehead to wall. Hold __1__ seconds. Relax. Repeat __10__ times per set. Do _2-3___ sets per session. Do _4__ sessions per week.

## 2017-01-13 NOTE — Therapy (Signed)
[place], [place], [address] Phone: [phone]   Fax:  [phone]  Physical Therapy Treatment  Patient Details  Name: Emily Mcdonald MRN: [id number] Date of Birth: Mar 31, 1956 Referring Provider: Dr Steva Colder  Encounter Date: 01/13/2017      PT End of Session - 01/13/17 1652    Visit Number 2--   Number of Visits 6   Date for PT Re-Evaluation 02/10/17   PT Start Time 2979   PT Stop Time 1733   PT Time Calculation (min) 41 min   Activity Tolerance Patient tolerated treatment well      Past Medical History:  Diagnosis Date  . Arthritis 03-31-11   arthritis fingers, and toes more right sided  . Cancer [facility]) 03-31-11   dx. endometrial cancer , surgery planned  . Hypertension 03-31-11   tx. med  . Obesity     Past Surgical History:  Procedure Laterality Date  . ABDOMINAL HYSTERECTOMY  04/06/11   Robotic BSO, LND    There were no vitals filed for this visit.      Subjective Assessment - 01/13/17 1653    Subjective Emily Mcdonald reports that her Rt toe is hurting some as she feels like its because her foot has been flat. Feels like the exercises have gotten easier. 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    Currently in Pain? No/denies            [facility] PT Assessment - 01/13/17 0001      Assessment   Medical Diagnosis Rt post tib tendonosis, questionable talar stress fx     Circumferential Edema   Circumferential - Right 24.1     Figure 8 Edema   Figure 8 - Right  51     AROM   Right Ankle Dorsiflexion 16     PROM   Overall PROM Comments great toe extension 51     Strength   Right Ankle Dorsiflexion 5/5   Right Ankle Plantar Flexion 4/5   Right Ankle Inversion 4+/5   Right Ankle Eversion 5/5                     [facility] Adult PT  Treatment/Exercise - 01/13/17 0001      Iontophoresis   Type of Iontophoresis Dexamethasone   Location medial Rt ankle   Dose 1.0 cc   Time 68m patch     Manual Therapy   Manual Therapy Joint mobilization   Joint Mobilization Rt great toe for flex/ext, first ray mobs, posterior talus mobs and calcaneal rocks     Ankle Exercises: Aerobic   Stationary Bike L3x5'     Ankle Exercises: Standing   SLS Rt with FWD leans 2x10   Heel Raises 10 reps  each toes in, out, straight   Heel Walk (Round Trip) 5x30sec     Ankle Exercises: Stretches   Other Stretch prostretch 2x30sec                PT Education - 01/13/17 1706    Education provided Yes   Education Details HEP   Person(s) Educated Patient   Methods Demonstration;Explanation   Comprehension Returned demonstration;Verbalized understanding             PT Long Term Goals - 01/13/17 1655  PT LONG TERM GOAL #1   Title I with advanced HEP ( 02/10/17)    Status On-going     PT LONG TERM GOAL #2   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 with no more than 1/10 pain in her Rt ankle ( 02/10/17)    Status On-going  has been trying her regular shoes a little, not full time yet     PT LONG TERM GOAL #3   Title demo Rt ankle strength =/> 5-/5 ( 02/10/17)    Status Partially Met     PT LONG TERM GOAL #4   Title perform Rt SLS =/> 15 sec ( 02/10/17)    Status On-going     PT LONG TERM GOAL #5   Title improve FOTO =/< 30% limited ( 02/10/17)    Status On-going               Plan - 01/13/17 1734    Clinical Impression Statement This is Emily Mcdonald's second visit, she is doing very well, ROM in her ankle is WNL, her strength is improving as is her edema.  She has partially met her goals.  There is some hypomobility in the Rt great toe and pain with ambulation there due to limited motion.  she would benefit from another treatment to further great toe extension, proprioception and ankle strength.     Rehab Potential Excellent   PT Frequency Biweekly   PT Duration 6 weeks   PT Treatment/Interventions Moist Heat;Ultrasound;Therapeutic exercise;Dry needling;Taping;Vasopneumatic Device;Manual techniques;Neuromuscular re-education;Cryotherapy;Electrical Stimulation;Iontophoresis 57m/ml Dexamethasone;Patient/family education;Passive range of motion   PT Next Visit Plan see in two weeks, FOTO and assess progress and need for more therapy.    Recommended Other Services custom orthotics   Consulted and Agree with Plan of Care Patient      Patient will benefit from skilled therapeutic intervention in order to improve the following deficits and impairments:  Decreased range of motion, Pain, Decreased strength, Increased edema  Visit Diagnosis: Pain in right ankle and joints of right foot  Muscle weakness (generalized)  Other abnormalities of gait and mobility     Problem List Patient Active Problem List   Diagnosis Date Noted  . Foot pain, right 11/11/2016  . Arthritis of right knee 10/27/2016  . Need for Zostavax administration 01/05/2016  . Needs flu shot 01/05/2016  . Obesity (BMI 30-39.9) 01/04/2014  . [place]omet[place] 03/19/2011  . RESTLESS LEG SYNDROME 11/28/2007  . HYPERTENSION, BENIGN ESSENTIAL 03/17/2006    SJeral PinchPT 01/13/2017, 5:43 PM  [facility] [place] [address]Phone: [phone]  Fax:  [phone] Name: Emily SMOLINSKIMRN: [id number] of Birth: 112/06/1955

## 2017-01-20 ENCOUNTER — Encounter: Payer: BLUE CROSS/BLUE SHIELD | Admitting: Physical Therapy

## 2017-01-27 ENCOUNTER — Ambulatory Visit (INDEPENDENT_AMBULATORY_CARE_PROVIDER_SITE_OTHER): Payer: BLUE CROSS/BLUE SHIELD | Admitting: Physical Therapy

## 2017-01-27 ENCOUNTER — Encounter: Payer: Self-pay | Admitting: Physical Therapy

## 2017-01-27 DIAGNOSIS — M6281 Muscle weakness (generalized): Secondary | ICD-10-CM

## 2017-01-27 DIAGNOSIS — R2689 Other abnormalities of gait and mobility: Secondary | ICD-10-CM

## 2017-01-27 DIAGNOSIS — M25571 Pain in right ankle and joints of right foot: Secondary | ICD-10-CM | POA: Diagnosis not present

## 2017-01-27 NOTE — Therapy (Addendum)
[place], [place], [address] Phone: [phone]   Fax:  [phone]  Physical Therapy Treatment  Patient Details  Name: Emily Mcdonald MRN: [id number] Date of Birth: 1955/06/09 Referring Provider: Dr Steva Colder  Encounter Date: 01/27/2017      PT End of Session - 01/27/17 1602    Visit Number 3   Number of Visits 6   Date for PT Re-Evaluation 02/10/17   PT Start Time 1602   PT Stop Time 1700   PT Time Calculation (min) 58 min   Activity Tolerance Patient tolerated treatment well      Past Medical History:  Diagnosis Date  . Arthritis 03-31-11   arthritis fingers, and toes more right sided  . Cancer [facility]) 03-31-11   dx. endometrial cancer , surgery planned  . Hypertension 03-31-11   tx. med  . Obesity     Past Surgical History:  Procedure Laterality Date  . ABDOMINAL HYSTERECTOMY  04/06/11   Robotic BSO, LND    There were no vitals filed for this visit.      Subjective Assessment - 01/27/17 1606    Subjective Pt reports that the second time she performed the heel raises had increased pain and swelling in the ankle so she stopped them. The ankle is still alittle swollen.  She has also been wearing her regular shoes more to see how the ankle is.    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    Currently in Pain? Yes   Pain Score 1    Pain Location Ankle   Pain Orientation Right;Medial   Pain Descriptors / Indicators Sore   Pain Type Acute pain   Pain Onset More than a month ago   Pain Frequency Intermittent   Aggravating Factors  heel raises   Pain Relieving Factors rest and wearing sneakers.             [facility] PT Assessment - 01/27/17 0001      Assessment   Medical Diagnosis Rt post tib tendonosis, questionable talar stress fx   Referring Provider Dr Steva Colder   Onset Date/Surgical  Date 09/12/16   Hand Dominance Right   Next MD Visit after therapy   Prior Therapy none     Observation/Other Assessments   Focus on Therapeutic Outcomes (FOTO)  36% limited     Circumferential Edema   Circumferential - Right 26.0     Figure 8 Edema   Figure 8 - Right  51.1     PROM   Overall PROM Comments great toe extension 45   Right Ankle Dorsiflexion 18   Right Ankle Eversion 24     Strength   Right/Left Ankle Right   Right Ankle Dorsiflexion 5/5   Right Ankle Plantar Flexion 4+/5  pain with turning toes in   Right Ankle Inversion 5/5   Right Ankle Eversion 5/5                     [facility] Adult PT Treatment/Exercise - 01/27/17 0001      Exercises   Exercises Ankle     Modalities   Modalities Vasopneumatic;Iontophoresis     Iontophoresis   Type of Iontophoresis Dexamethasone   Location medial Rt ankle   Dose 1.0 cc   Time 135m patch     Vasopneumatic   Number Minutes Vasopneumatic  15 minutes   Vasopnuematic Location  Ankle   Vasopneumatic Pressure Medium   Vasopneumatic Temperature  3*     Manual Therapy   Manual Therapy Joint mobilization   Joint Mobilization Rt great toe for flex/ext, first ray mobs, posterior talus mobs and calcaneal rocks     Ankle Exercises: Aerobic   Stationary Bike L3x5'     Ankle Exercises: Standing   BAPS Standing;Level 3;15 reps  with bilat HHA, clock numbers, CW/CCW     Ankle Exercises: Seated   Other Seated Ankle Exercises 30reps, green band Rt ankle PF, ever, inver                PT Education - 01/27/17 1617    Education provided Yes   Education Details DF ankle green band   Person(s) Educated Patient   Methods Explanation;Demonstration;Handout   Comprehension Returned demonstration;Verbalized understanding             PT Long Term Goals - 01/27/17 1652      PT LONG TERM GOAL #1   Title I with advanced HEP ( 02/10/17)    Status Achieved     PT LONG TERM GOAL #2   Title ambulate  throughout her school in normal shoes with no more than 1/10 pain in her Rt ankle ( 02/10/17)    Status Partially Met  able to ambulate in normal shoes some of the times.     PT LONG TERM GOAL #3   Title demo Rt ankle strength =/> 5-/5 ( 02/10/17)    Status Partially Met  all but plantarflexion     PT LONG TERM GOAL #4   Title perform Rt SLS =/> 15 sec ( 02/10/17)    Status On-going     PT LONG TERM GOAL #5   Title improve FOTO =/< 30% limited ( 02/10/17)    Status On-going  36% limited               Plan - 01/27/17 1654    Clinical Impression Statement Pt with slight set back after performing heel raised in inversion.  She had stopped them, her ankle has some increased heat and edema currently. Her ankle strength is improving and her ROM if good except  Rt great toe extension limited due to arthritis.  She is pleased with her progress so far and feels like she has the tools she needs to continue rehab.  Recommend patient follow  up with MD and get orthotics to help support her Rt arch/foot.    PT Frequency Biweekly   PT Duration 6 weeks   PT Treatment/Interventions Moist Heat;Ultrasound;Therapeutic exercise;Dry needling;Taping;Vasopneumatic Device;Manual techniques;Neuromuscular re-education;Cryotherapy;Electrical Stimulation;Iontophoresis 38m/ml Dexamethasone;Patient/family education;Passive range of motion   PT Next Visit Plan place pt on hold per her request, if we don't hear from her in 2 wks she is doing well and doesn't need to return.  If her pain/swelling get worse she will return.    Consulted and Agree with Plan of Care Patient      Patient will benefit from skilled therapeutic intervention in order to improve the following deficits and impairments:  Decreased range of motion, Pain, Decreased strength, Increased edema  Visit Diagnosis: Pain in right ankle and joints of right foot  Muscle weakness (generalized)  Other abnormalities of gait and  mobility     Problem List Patient Active Problem List   Diagnosis Date Noted  . Foot pain, right 11/11/2016  . Arthritis of right knee 10/27/2016  . Need for  Zostavax administration 01/05/2016  . Needs flu shot 01/05/2016  . Obesity (BMI 30-39.9) 01/04/2014  . Endometrial cancer ([place]) 03/19/2011  . RESTLESS LEG SYNDROME 11/28/2007  . HYPERTENSION, BENIGN ESSENTIAL 03/17/2006    Jeral Pinch PT  01/27/2017, 4:58 PM  [facility] [place], [place], [address] Phone: [phone]   Fax:  [phone]  Name: Emily Mcdonald MRN: [id number] Date of Birth: Sep 12, 1955   PHYSICAL THERAPY DISCHARGE SUMMARY  Visits from Start of Care: 3 Current functional level related to goals / functional outcomes: See above for function at her last visit, unknown current function   Remaining deficits: unknown   Education / Equipment: HEP Plan: Patient agrees to discharge.  Patient goals were partially met. Patient is being discharged due to the patient's request.  ?????    Jeral Pinch, PT 03/23/17 10:49 AM

## 2017-01-27 NOTE — Patient Instructions (Addendum)
Plantar Flexion: Resisted    Anchor behind, tubing around left foot, press down. Repeat __10-15__ times per set. Do __3__ sets per session. Do __1__ sessions per day.  [name]

## 2017-02-01 ENCOUNTER — Encounter: Payer: Self-pay | Admitting: Family Medicine

## 2017-02-01 ENCOUNTER — Ambulatory Visit (INDEPENDENT_AMBULATORY_CARE_PROVIDER_SITE_OTHER): Payer: BLUE CROSS/BLUE SHIELD | Admitting: Family Medicine

## 2017-02-01 VITALS — BP 115/70 | HR 68 | Wt 203.0 lb

## 2017-02-01 DIAGNOSIS — M79671 Pain in right foot: Secondary | ICD-10-CM | POA: Diagnosis not present

## 2017-02-01 DIAGNOSIS — Z23 Encounter for immunization: Secondary | ICD-10-CM | POA: Diagnosis not present

## 2017-02-01 NOTE — Patient Instructions (Addendum)
Thank you for coming in today. Use the orthotics.  Recheck with me as needed.  Use the scapoid pad in the right shoe for shoes that you cannot fit the orthotics in.

## 2017-02-02 NOTE — Progress Notes (Signed)
Emily Mcdonald is a 61 y.o. female who presents to [place] today for right foot pain. Patient has been seen several times over the last 3 months for right medial foot pain thought to be multifactorial but potentially also related to posterior tibialis tendinitis. She eventually was weaned out of the cam walker boot with physical therapy. She's doing pretty well overall with only minimal pain. Physical therapy recommends orthotics and she would like to get this today if possible.   Past Medical History:  Diagnosis Date  . Arthritis 03-31-11   arthritis fingers, and toes more right sided  . Cancer [facility]) 03-31-11   dx. endometrial cancer , surgery planned  . Hypertension 03-31-11   tx. med  . Obesity    Past Surgical History:  Procedure Laterality Date  . ABDOMINAL HYSTERECTOMY  04/06/11   Robotic BSO, LND   Social History  Substance Use Topics  . Smoking status: Never Smoker  . Smokeless tobacco: Never Used  . Alcohol use Not on file     ROS:  As above   Medications: Current Outpatient Prescriptions  Medication Sig Dispense Refill  . AMBULATORY NON FORMULARY MEDICATION Medication Name: Viactive Calcium with D    . Cholecalciferol (D3-1000 PO) Take by mouth daily.     . Cyanocobalamin (VITAMIN B-12 CR PO) Take 1 tablet by mouth daily.     . diclofenac sodium (VOLTAREN) 1 % GEL Apply 2 g topically 4 (four) times daily. To affected joint. 100 g 11  . fish oil-omega-3 fatty acids 1000 MG capsule Take 2 g by mouth daily.     [name] losartan (COZAAR) 100 MG tablet TAKE ONE TABLET BY MOUTH ONCE DAILY. 90 tablet 2  . Multiple Vitamin (MULTIVITAMIN) tablet Take 1 tablet by mouth daily.    . vitamin C (ASCORBIC ACID) 500 MG tablet Take 500 mg by mouth daily.     No current facility-administered medications for this visit.    Allergies  Allergen Reactions  . Ace Inhibitors     REACTION: cough     Exam:  BP 115/70   Pulse 68   Wt  203 lb (92.1 kg)   LMP 10/16/2010   BMI 35.96 kg/m  General: Well Developed, well nourished, and in no acute distress.  Neuro/Psych: Alert and oriented x3, extra-ocular muscles intact, able to move all 4 extremities, sensation grossly intact. Skin: Warm and dry, no rashes noted.  Respiratory: Not using accessory muscles, speaking in full sentences, trachea midline.  Cardiovascular: Pulses palpable, no extremity edema. Abdomen: Does not appear distended. MSK:  Right foot no effusion no erythema. Significant pronation and pes planus deformity present.         Patient was fitted for a : standard, cushioned, semi-rigid orthotic. The orthotic was heated and afterward the patient stood on the orthotic blank positioned on the orthotic stand. The patient was positioned in subtalar neutral position and 10 degrees of ankle dorsiflexion in a weight bearing stance. After completion of molding, a stable base was applied to the orthotic blank. The blank was ground to a stable position for weight bearing. Size: 9 Base: [employer] and safety inspector and Padding: None The patient ambulated these, and they were very comfortable.    No results found for this or any previous visit (from the past 48 hour(s)). No results found.    Assessment and Plan: 61 y.o. female with foot pain improving. Plan for orthotics today as noted above.  Recheck with  me as needed.  Influenza vaccine given as well.    Orders Placed This Encounter  Procedures  . Flu Vaccine QUAD 36+ mos IM   No orders of the defined types were placed in this encounter.   Discussed warning signs or symptoms. Please see discharge instructions. Patient expresses understanding.   I spent 40 minutes with this patient independent of orthotic preparation, greater than 50% was face-to-face time counseling regarding risks and benefits of orthotics expected side effects in addition to what type of shoes or compatible with semirigid  orthotics as well as expected lifetime of orthotics.

## 2017-02-03 ENCOUNTER — Encounter: Payer: BLUE CROSS/BLUE SHIELD | Admitting: Physical Therapy

## 2017-02-10 ENCOUNTER — Encounter: Payer: BLUE CROSS/BLUE SHIELD | Admitting: Physical Therapy

## 2017-07-26 ENCOUNTER — Other Ambulatory Visit: Payer: Self-pay | Admitting: Family Medicine

## 2017-07-26 DIAGNOSIS — I10 Essential (primary) hypertension: Secondary | ICD-10-CM

## 2017-10-26 ENCOUNTER — Encounter: Payer: Self-pay | Admitting: Family Medicine

## 2017-10-26 ENCOUNTER — Ambulatory Visit: Payer: BLUE CROSS/BLUE SHIELD | Admitting: Family Medicine

## 2017-10-26 ENCOUNTER — Ambulatory Visit (INDEPENDENT_AMBULATORY_CARE_PROVIDER_SITE_OTHER): Payer: BLUE CROSS/BLUE SHIELD

## 2017-10-26 VITALS — BP 134/73 | HR 71 | Ht 63.0 in | Wt 210.0 lb

## 2017-10-26 DIAGNOSIS — Z1231 Encounter for screening mammogram for malignant neoplasm of breast: Secondary | ICD-10-CM

## 2017-10-26 DIAGNOSIS — G2581 Restless legs syndrome: Secondary | ICD-10-CM

## 2017-10-26 DIAGNOSIS — I10 Essential (primary) hypertension: Secondary | ICD-10-CM

## 2017-10-26 LAB — COMPLETE METABOLIC PANEL WITH GFR
AG RATIO: 1.6 (calc) (ref 1.0–2.5)
ALT: 22 U/L (ref 6–29)
AST: 23 U/L (ref 10–35)
Albumin: 4.3 g/dL (ref 3.6–5.1)
Alkaline phosphatase (APISO): 81 U/L (ref 33–130)
BUN: 21 mg/dL (ref 7–25)
CALCIUM: 9.5 mg/dL (ref 8.6–10.4)
CO2: 28 mmol/L (ref 20–32)
CREATININE: 0.8 mg/dL (ref 0.50–0.99)
Chloride: 105 mmol/L (ref 98–110)
GFR, EST AFRICAN AMERICAN: 92 mL/min/{1.73_m2} (ref >=60)
GFR, EST NON AFRICAN AMERICAN: 79 mL/min/{1.73_m2} (ref >=60)
GLOBULIN: 2.7 g/dL (ref 1.9–3.7)
Glucose, Bld: 93 mg/dL (ref 65–99)
POTASSIUM: 4.6 mmol/L (ref 3.5–5.3)
SODIUM: 141 mmol/L (ref 135–146)
TOTAL PROTEIN: 7 g/dL (ref 6.1–8.1)
Total Bilirubin: 0.8 mg/dL (ref 0.2–1.2)

## 2017-10-26 LAB — LIPID PANEL
CHOLESTEROL: 188 mg/dL (ref <200)
HDL: 57 mg/dL (ref >50)
LDL Cholesterol (Calc): 108 mg/dL (calc) — ABNORMAL HIGH
Non-HDL Cholesterol (Calc): 131 mg/dL (calc) — ABNORMAL HIGH (ref <130)
Total CHOL/HDL Ratio: 3.3 (calc) (ref <5.0)
Triglycerides: 118 mg/dL (ref <150)

## 2017-10-26 MED ORDER — LOSARTAN POTASSIUM 100 MG PO TABS: 100.0000 mg | ORAL_TABLET | Freq: Every day | ORAL | 1 refills | Status: DC

## 2017-10-26 NOTE — Progress Notes (Signed)
Subjective:    CC: HTN f/u   HPI:  Hypertension- Pt denies chest pain, SOB, dizziness, or heart palpitations.  Taking meds as directed w/o problems.  Denies medication side effects.    She is overdue for mammogram.  Last one was about 2-1/2 years ago.  She has had a hysterectomy so no longer needs Pap smears.  She did get her colonoscopy updated with digestive health with Dr. Misty Stanley last summer.  Restless leg syndrome-she is actually been doing really well recently.  She says when she is off of work she does better.  Past medical history, Surgical history, Family history not pertinant except as noted below, Social history, Allergies, and medications have been entered into the medical record, reviewed, and corrections made.   Review of Systems: No fevers, chills, night sweats, weight loss, chest pain, or shortness of breath.   Objective:    General: Well Developed, well nourished, and in no acute distress.  Neuro: Alert and oriented x3, extra-ocular muscles intact, sensation grossly intact.  HEENT: Normocephalic, atraumatic  Skin: Warm and dry, no rashes. Cardiac: Regular rate and rhythm, no murmurs rubs or gallops, no lower extremity edema.  Respiratory: Clear to auscultation bilaterally. Not using accessory muscles, speaking in full sentences.   Impression and Recommendations:    HTN -  Well controlled. Continue current regimen. Follow up in  6 months.   RLS - doing well.    Due for mammogram.

## 2017-11-15 ENCOUNTER — Encounter: Payer: Self-pay | Admitting: Family Medicine

## 2018-01-23 DIAGNOSIS — H04123 Dry eye syndrome of bilateral lacrimal glands: Secondary | ICD-10-CM | POA: Diagnosis not present

## 2018-01-23 DIAGNOSIS — H40033 Anatomical narrow angle, bilateral: Secondary | ICD-10-CM | POA: Diagnosis not present

## 2018-04-13 IMAGING — DX DG FOOT COMPLETE 3+V*R*
3 series · 3 of 3 positions shown · non-contrast
Comparison: None.

CLINICAL DATA: 60 year-old female c/o RIGHT medial foot pain x 1
week. NKI. Pain is somewhat relieved when sitting down.

EXAM:
RIGHT FOOT COMPLETE - 3+ VIEW

[foot ap]
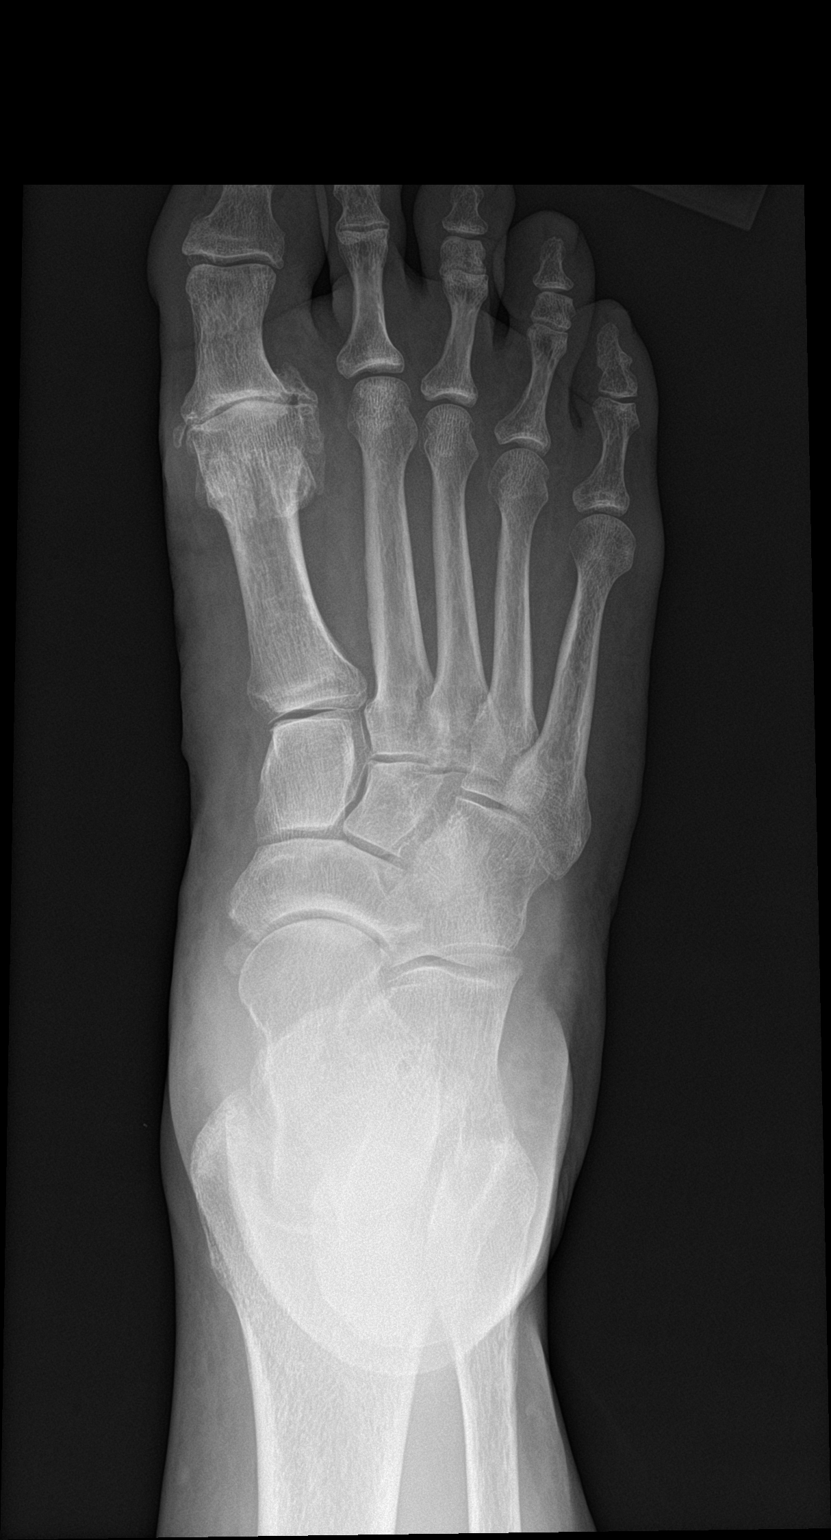

[foot obl]
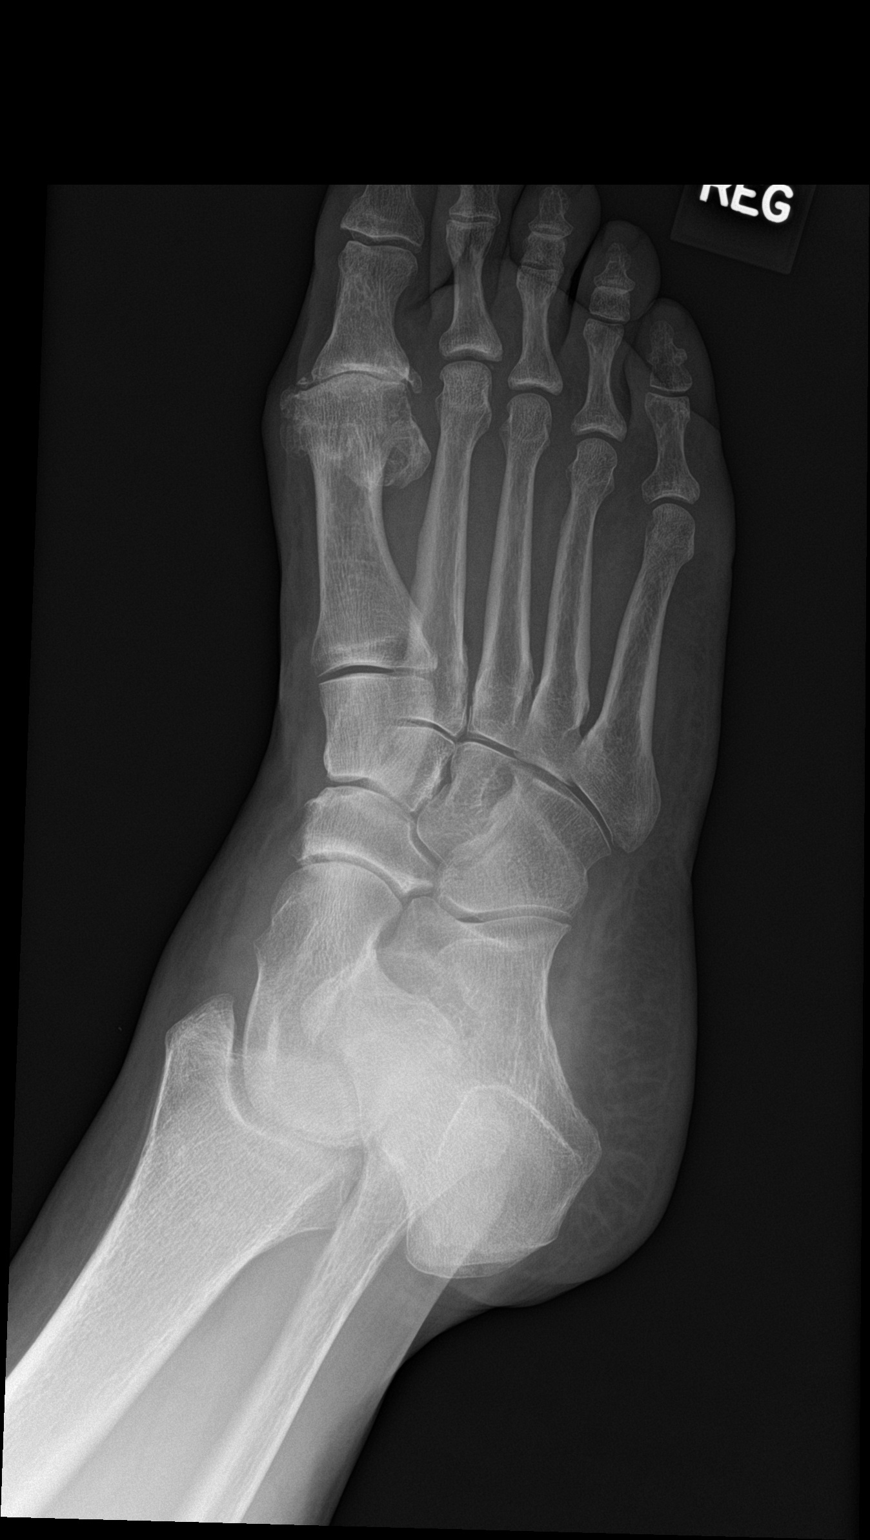

[foot lat]
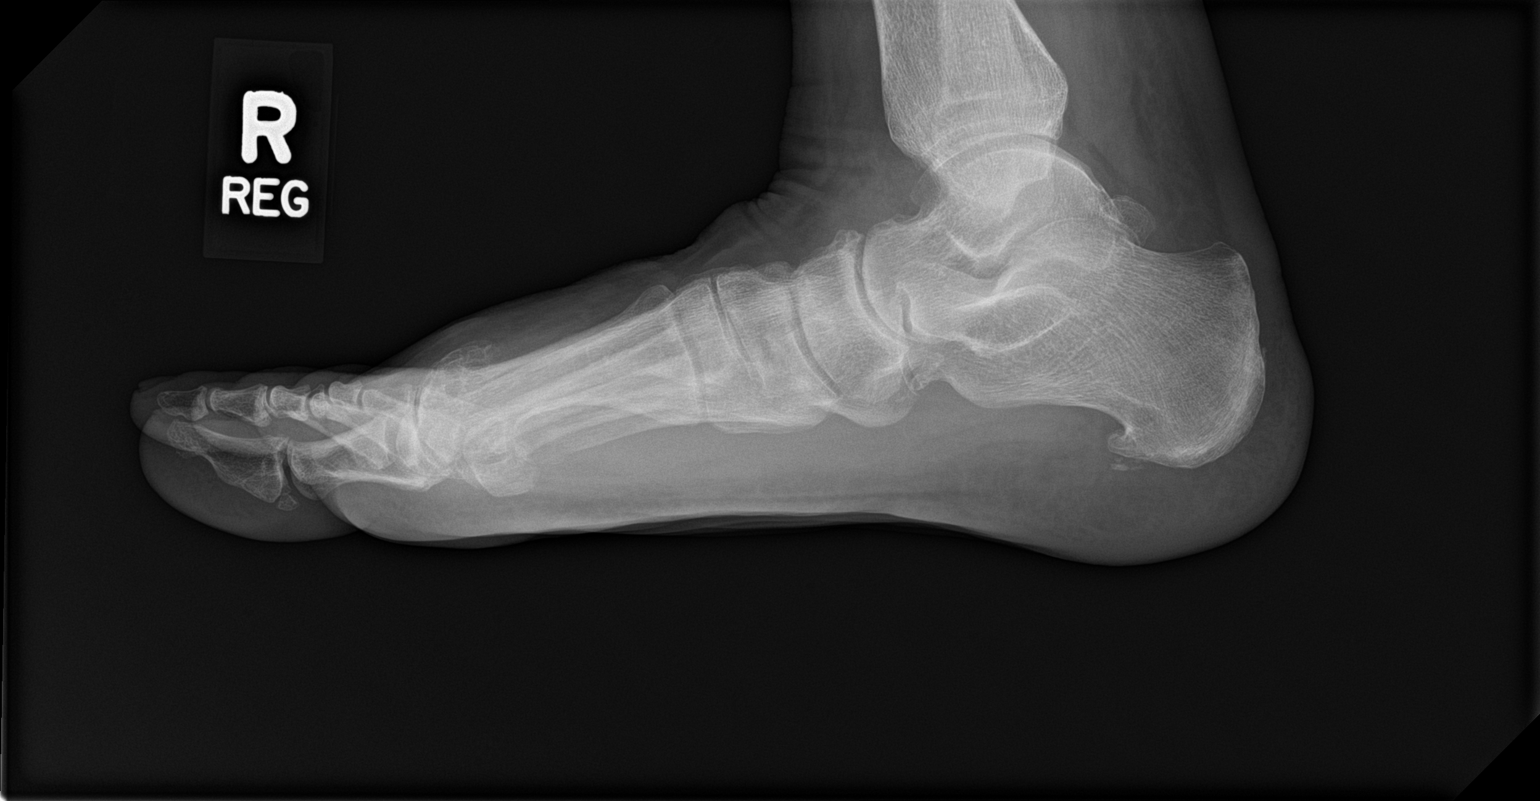

[3 of 3 positions shown; findings below may reference images not displayed]

FINDINGS: No fracture.  No bone lesion.

There is joint space narrowing with prominent marginal osteophytes
at the first metatarsophalangeal joint consistent with moderate to
advanced osteoarthritis.

Remaining joints are normally spaced and aligned with no other
significant arthropathic change.

Small plantar calcaneal spur.

Soft tissues are unremarkable.
IMPRESSION: 1. No fracture, dislocation or acute finding.
2. Moderate to advanced first metatarsophalangeal joint
osteoarthritis.
3. Small plantar calcaneal spur.

## 2018-04-25 ENCOUNTER — Other Ambulatory Visit: Payer: Self-pay

## 2018-04-25 DIAGNOSIS — I10 Essential (primary) hypertension: Secondary | ICD-10-CM

## 2018-04-25 MED ORDER — LOSARTAN POTASSIUM 100 MG PO TABS: 100.0000 mg | ORAL_TABLET | Freq: Every day | ORAL | 0 refills | Status: DC

## 2018-04-25 NOTE — Telephone Encounter (Signed)
Needs refill on Losartan to hold her over until appt 05/18/2018  30 day sent to pharmacy, pt advised she must keep scheduled follow up. Pt agreeable

## 2018-04-27 ENCOUNTER — Other Ambulatory Visit: Payer: Self-pay | Admitting: Family Medicine

## 2018-04-27 DIAGNOSIS — I10 Essential (primary) hypertension: Secondary | ICD-10-CM

## 2018-04-27 MED ORDER — LOSARTAN POTASSIUM 100 MG PO TABS: 100.0000 mg | ORAL_TABLET | Freq: Every day | ORAL | 0 refills | Status: DC

## 2018-04-27 NOTE — Telephone Encounter (Signed)
Dated Rx sent

## 2018-04-27 NOTE — Telephone Encounter (Signed)
Patient picked up her prescription today for the losartan (COZAAR) 100 MG tablet [[id number]] . Wanted to know if she would get a couple when she runs out on 05/28/2018 until her appointment on 06/01/2018 or a month refill. She just wanted to make sure there was no gap.

## 2018-05-18 ENCOUNTER — Ambulatory Visit: Payer: BLUE CROSS/BLUE SHIELD | Admitting: Family Medicine

## 2018-06-01 ENCOUNTER — Ambulatory Visit: Payer: BLUE CROSS/BLUE SHIELD | Admitting: Family Medicine

## 2018-06-01 ENCOUNTER — Encounter: Payer: Self-pay | Admitting: Family Medicine

## 2018-06-01 VITALS — BP 130/77 | HR 77 | Ht 63.0 in | Wt 215.0 lb

## 2018-06-01 DIAGNOSIS — I10 Essential (primary) hypertension: Secondary | ICD-10-CM | POA: Diagnosis not present

## 2018-06-01 DIAGNOSIS — G2581 Restless legs syndrome: Secondary | ICD-10-CM

## 2018-06-01 DIAGNOSIS — R350 Frequency of micturition: Secondary | ICD-10-CM

## 2018-06-01 DIAGNOSIS — Z23 Encounter for immunization: Secondary | ICD-10-CM

## 2018-06-01 DIAGNOSIS — N3000 Acute cystitis without hematuria: Secondary | ICD-10-CM

## 2018-06-01 LAB — POCT URINALYSIS DIPSTICK
BILIRUBIN UA: NEGATIVE
Blood, UA: NEGATIVE
GLUCOSE UA: NEGATIVE
KETONES UA: NEGATIVE
Nitrite, UA: NEGATIVE
PROTEIN UA: NEGATIVE
SPEC GRAV UA: 1.025 (ref 1.010–1.025)
Urobilinogen, UA: 0.2 E.U./dL
pH, UA: 5 (ref 5.0–8.0)

## 2018-06-01 MED ORDER — LOSARTAN POTASSIUM 100 MG PO TABS: 100.0000 mg | ORAL_TABLET | Freq: Every day | ORAL | 1 refills | Status: DC

## 2018-06-01 MED ORDER — SULFAMETHOXAZOLE-TRIMETHOPRIM 800-160 MG PO TABS: 1.0000 | ORAL_TABLET | Freq: Two times a day (BID) | ORAL | 0 refills | Status: DC

## 2018-06-01 NOTE — Patient Instructions (Signed)
Urinary Tract Infection, Adult A urinary tract infection (UTI) is an infection of any part of the urinary tract. The urinary tract includes:  The kidneys.  The ureters.  The bladder.  The urethra. These organs make, store, and get rid of pee (urine) in the body. What are the causes? This is caused by germs (bacteria) in your genital area. These germs grow and cause swelling (inflammation) of your urinary tract. What increases the risk? You are more likely to develop this condition if:  You have a small, thin tube (catheter) to drain pee.  You cannot control when you pee or poop (incontinence).  You are female, and: ? You use these methods to prevent pregnancy: ? A medicine that kills sperm (spermicide). ? A device that blocks sperm (diaphragm). ? You have low levels of a female hormone (estrogen). ? You are pregnant.  You have genes that add to your risk.  You are sexually active.  You take antibiotic medicines.  You have trouble peeing because of: ? A prostate that is bigger than normal, if you are female. ? A blockage in the part of your body that drains pee from the bladder (urethra). ? A kidney stone. ? A nerve condition that affects your bladder (neurogenic bladder). ? Not getting enough to drink. ? Not peeing often enough.  You have other conditions, such as: ? Diabetes. ? A weak disease-fighting system (immune system). ? Sickle cell disease. ? Gout. ? Injury of the spine. What are the signs or symptoms? Symptoms of this condition include:  Needing to pee right away (urgently).  Peeing often.  Peeing small amounts often.  Pain or burning when peeing.  Blood in the pee.  Pee that smells bad or not like normal.  Trouble peeing.  Pee that is cloudy.  Fluid coming from the vagina, if you are female.  Pain in the belly or lower back. Other symptoms include:  Throwing up (vomiting).  No urge to eat.  Feeling mixed up (confused).  Being tired  and grouchy (irritable).  A fever.  Watery poop (diarrhea). How is this treated? This condition may be treated with:  Antibiotic medicine.  Other medicines.  Drinking enough water. Follow these instructions at home:  Medicines  Take over-the-counter and prescription medicines only as told by your doctor.  If you were prescribed an antibiotic medicine, take it as told by your doctor. Do not stop taking it even if you start to feel better. General instructions  Make sure you: ? Pee until your bladder is empty. ? Do not hold pee for a long time. ? Empty your bladder after sex. ? Wipe from front to back after pooping if you are a female. Use each tissue one time when you wipe.  Drink enough fluid to keep your pee pale yellow.  Keep all follow-up visits as told by your doctor. This is important. Contact a doctor if:  You do not get better after 1-2 days.  Your symptoms go away and then come back. Get help right away if:  You have very bad back pain.  You have very bad pain in your lower belly.  You have a fever.  You are sick to your stomach (nauseous).  You are throwing up. Summary  A urinary tract infection (UTI) is an infection of any part of the urinary tract.  This condition is caused by germs in your genital area.  There are many risk factors for a UTI. These include having a small, thin   tube to drain pee and not being able to control when you pee or poop.  Treatment includes antibiotic medicines for germs.  Drink enough fluid to keep your pee pale yellow. This information is not intended to replace advice given to you by your health care provider. Make sure you discuss any questions you have with your health care provider. Document Released: 10/13/2007 Document Revised: 11/03/2017 Document Reviewed: 11/03/2017 Elsevier Interactive Patient Education  2019 [employer]  

## 2018-06-01 NOTE — Progress Notes (Signed)
Subjective:    CC: 6 mo f/u  HPI:  Hypertension- Pt denies chest pain, SOB, dizziness, or heart palpitations.  Taking meds as directed w/o problems.  Denies medication side effects.    Also reports for about 7 days she has had some urinary frequency and dysuria.  No blood no fevers or chills.  She says sometimes she will get similar symptoms and take Azo for a few days and that actually seems to take care of it and it resolves but this time it has not. No back pain.   Restless leg syndrome-she is actually doing really well.  She is not currently on medication and says right now her symptoms have been well controlled.  Past medical history, Surgical history, Family history not pertinant except as noted below, Social history, Allergies, and medications have been entered into the medical record, reviewed, and corrections made.   Review of Systems: No fevers, chills, night sweats, weight loss, chest pain, or shortness of breath.   Objective:    General: Well Developed, well nourished, and in no acute distress.  Neuro: Alert and oriented x3, extra-ocular muscles intact, sensation grossly intact.  HEENT: Normocephalic, atraumatic  Skin: Warm and dry, no rashes. Cardiac: Regular rate and rhythm, no murmurs rubs or gallops, no lower extremity edema.  Respiratory: Clear to auscultation bilaterally. Not using accessory muscles, speaking in full sentences.   Impression and Recommendations:    HTN - Well controlled. Continue current regimen. Follow up in  6 mop   Urinary tract infection-urinalysis only positive for leukocytes but based on symptoms we will go ahead and treat with Bactrim for 3 days.  Call if not improving.  If not improving will repeat urinalysis as well as urine culture for further work-up.  Left leg syndrome-Rarely symptomatic.  Not currently on treatment.  Discussed need for shingles vaccine.  Patient opted to get her first 1 today.

## 2018-06-05 DIAGNOSIS — I10 Essential (primary) hypertension: Secondary | ICD-10-CM | POA: Diagnosis not present

## 2018-06-06 LAB — BASIC METABOLIC PANEL WITH GFR
BUN: 25 mg/dL (ref 7–25)
CALCIUM: 9.5 mg/dL (ref 8.6–10.4)
CHLORIDE: 105 mmol/L (ref 98–110)
CO2: 25 mmol/L (ref 20–32)
Creat: 0.89 mg/dL (ref 0.50–0.99)
GFR, Est African American: 80 mL/min/{1.73_m2} (ref >=60)
GFR, Est Non African American: 69 mL/min/{1.73_m2} (ref >=60)
GLUCOSE: 86 mg/dL (ref 65–99)
POTASSIUM: 4.1 mmol/L (ref 3.5–5.3)
Sodium: 138 mmol/L (ref 135–146)

## 2018-06-06 NOTE — Progress Notes (Signed)
All labs are normal. 

## 2018-07-31 ENCOUNTER — Ambulatory Visit: Payer: BLUE CROSS/BLUE SHIELD

## 2018-07-31 ENCOUNTER — Ambulatory Visit (INDEPENDENT_AMBULATORY_CARE_PROVIDER_SITE_OTHER): Payer: BLUE CROSS/BLUE SHIELD | Admitting: Family Medicine

## 2018-07-31 ENCOUNTER — Other Ambulatory Visit: Payer: Self-pay

## 2018-07-31 VITALS — Temp 98.3°F

## 2018-07-31 DIAGNOSIS — Z23 Encounter for immunization: Secondary | ICD-10-CM | POA: Diagnosis not present

## 2018-07-31 NOTE — Progress Notes (Signed)
Agree with documentation as above.   Catherine Metheney, MD  

## 2018-07-31 NOTE — Progress Notes (Signed)
Established Patient Office Visit  Subjective:  Patient ID: Emily Mcdonald, female    DOB: 04/26/1956  Age: 63 y.o. MRN: [id number]  CC:  Chief Complaint  Patient presents with  . Immunizations    HPI Emily Mcdonald presents for last shingles vaccine.   Past Medical History:  Diagnosis Date  . Arthritis 03-31-11   arthritis fingers, and toes more right sided  . Cancer [facility]) 03-31-11   dx. endometrial cancer , surgery planned  . Hypertension 03-31-11   tx. med  . Obesity     Past Surgical History:  Procedure Laterality Date  . ABDOMINAL HYSTERECTOMY  04/06/11   Robotic BSO, LND    Family History  Problem Relation Age of Onset  . Breast cancer Maternal Grandmother   . Lymphoma Paternal Grandmother 67  . Heart attack Mother 55  . Diabetes Mother        late 84s.   . Pulmonary fibrosis Mother        passed away from this  . Diabetes Father   . Diabetes Brother   . Hypertension Brother   . Diabetes Brother   . Hypertension Brother   . Lymphoma Brother     Social History   Socioeconomic History  . Marital status: Divorced    Spouse name: Not on file  . Number of children: Not on file  . Years of education: Not on file  . Highest education level: Not on file  Occupational History  . Occupation: Pharmacist, hospital    Comment: [place]  Social Needs  . Financial resource strain: Not on file  . Food insecurity:    Worry: Not on file    Inability: Not on file  . Transportation needs:    Medical: Not on file    Non-medical: Not on file  Tobacco Use  . Smoking status: Never Smoker  . Smokeless tobacco: Never Used  Substance and Sexual Activity  . Alcohol use: Not on file  . Drug use: Not on file  . Sexual activity: Never  Lifestyle  . Physical activity:    Days per week: Not on file    Minutes per session: Not on file  . Stress: Not on file  Relationships  . Social connections:    Talks on phone: Not on file    Gets together: Not on file   Attends religious service: Not on file    Active member of club or organization: Not on file    Attends meetings of clubs or organizations: Not on file    Relationship status: Not on file  . Intimate partner violence:    Fear of current or ex partner: Not on file    Emotionally abused: Not on file    Physically abused: Not on file    Forced sexual activity: Not on file  Other Topics Concern  . Not on file  Social History Narrative   1-2 caffeine drinks a day.  No regular exercise.      Outpatient Medications Prior to Visit  Medication Sig Dispense Refill  . AMBULATORY NON FORMULARY MEDICATION Medication Name: Viactive Calcium with D    . Cholecalciferol (D3-1000 PO) Take by mouth daily.     . Cyanocobalamin (VITAMIN B-12 CR PO) Take 1 tablet by mouth daily.     . cycloSPORINE (RESTASIS) 0.05 % ophthalmic emulsion 1 drop 2 (two) times daily.    . fish oil-omega-3 fatty acids 1000 MG capsule Take 2 g by mouth daily.     [name]  losartan (COZAAR) 100 MG tablet Take 1 tablet (100 mg total) by mouth daily. 90 tablet 1  . Multiple Vitamin (MULTIVITAMIN) tablet Take 1 tablet by mouth daily.    [name] sulfamethoxazole-trimethoprim (BACTRIM DS,SEPTRA DS) 800-160 MG tablet Take 1 tablet by mouth 2 (two) times daily. 20 tablet 0  . vitamin C (ASCORBIC ACID) 500 MG tablet Take 500 mg by mouth daily.     No facility-administered medications prior to visit.     Allergies  Allergen Reactions  . Ace Inhibitors     REACTION: cough    ROS Review of Systems    Objective:    Physical Exam  Temp 98.3 F (36.8 C) (Oral)   LMP 10/16/2010  Wt Readings from Last 3 Encounters:  06/01/18 215 lb (97.5 kg)  10/26/17 210 lb (95.3 kg)  02/01/17 203 lb (92.1 kg)     Health Maintenance Due  Topic Date Due  . HIV Screening  02/21/1970    There are no preventive care reminders to display for this patient.  Lab Results  Component Value Date   TSH 2.172 01/24/2014   Lab Results  Component Value  Date   WBC 4.3 11/11/2016   HGB 13.5 11/11/2016   HCT 40.6 11/11/2016   MCV 84.9 11/11/2016   PLT 166 11/11/2016   Lab Results  Component Value Date   NA 138 06/05/2018   K 4.1 06/05/2018   CO2 25 06/05/2018   GLUCOSE 86 06/05/2018   BUN 25 06/05/2018   CREATININE 0.89 06/05/2018   BILITOT 0.8 10/26/2017   ALKPHOS 68 11/11/2016   AST 23 10/26/2017   ALT 22 10/26/2017   PROT 7.0 10/26/2017   ALBUMIN 4.2 11/11/2016   CALCIUM 9.5 06/05/2018   Lab Results  Component Value Date   CHOL 188 10/26/2017   Lab Results  Component Value Date   HDL 57 10/26/2017   Lab Results  Component Value Date   LDLCALC 108 (H) 10/26/2017   Lab Results  Component Value Date   TRIG 118 10/26/2017   Lab Results  Component Value Date   CHOLHDL 3.3 10/26/2017   No results found for: HGBA1C    Assessment & Plan:  Need shingles vaccine - Patient tolerated injection well without complications.    Problem List Items Addressed This Visit    None    Visit Diagnoses    Need for shingles vaccine    -  Primary   Relevant Orders   Varicella-zoster vaccine IM (Shingrix) (Completed)      No orders of the defined types were placed in this encounter.   Follow-up: No follow-ups on file.    Lavell Luster, [place]

## 2018-12-04 ENCOUNTER — Other Ambulatory Visit: Payer: Self-pay | Admitting: Family Medicine

## 2018-12-04 DIAGNOSIS — I10 Essential (primary) hypertension: Secondary | ICD-10-CM

## 2018-12-07 ENCOUNTER — Encounter: Payer: Self-pay | Admitting: Family Medicine

## 2018-12-07 ENCOUNTER — Other Ambulatory Visit: Payer: Self-pay

## 2018-12-07 ENCOUNTER — Ambulatory Visit (INDEPENDENT_AMBULATORY_CARE_PROVIDER_SITE_OTHER): Payer: BC Managed Care – PPO

## 2018-12-07 ENCOUNTER — Ambulatory Visit (INDEPENDENT_AMBULATORY_CARE_PROVIDER_SITE_OTHER): Payer: BC Managed Care – PPO | Admitting: Family Medicine

## 2018-12-07 VITALS — BP 108/62 | HR 80 | Ht 63.0 in | Wt 201.0 lb

## 2018-12-07 DIAGNOSIS — R0989 Other specified symptoms and signs involving the circulatory and respiratory systems: Secondary | ICD-10-CM

## 2018-12-07 DIAGNOSIS — I10 Essential (primary) hypertension: Secondary | ICD-10-CM

## 2018-12-07 NOTE — Assessment & Plan Note (Signed)
Well-controlled.  Continue current regimen.  Due for updated lab work.  Labs pended and printed.

## 2018-12-07 NOTE — Progress Notes (Signed)
Acute Office Visit  Subjective:    Patient ID: Emily Mcdonald, female    DOB: [birth year], 63 y.o.   MRN: [id number]  Chief Complaint  Patient presents with  . Hypertension    HPI Patient is in today for   Hypertension- Pt denies chest pain, SOB, dizziness, or heart palpitations.  Taking meds as directed w/o problems.  Denies medication side effects.    She has a spot on her right forearm and left side of her stomach. Has some skin lesions that she feels are getting darker.   She does have a family history of pulmonary fibrosis in her mother and grandfather.  She herself has never been a smoker.  She denies any shortness of breath or increased work of breathing.  Past Medical History:  Diagnosis Date  . Arthritis 03-31-11   arthritis fingers, and toes more right sided  . Cancer [facility]) 03-31-11   dx. endometrial cancer , surgery planned  . Hypertension 03-31-11   tx. med  . Obesity     Past Surgical History:  Procedure Laterality Date  . ABDOMINAL HYSTERECTOMY  04/06/11   Robotic BSO, LND    Family History  Problem Relation Age of Onset  . Breast cancer Maternal Grandmother   . Lymphoma Paternal Grandmother 35  . Heart attack Mother 6  . Diabetes Mother        late 64s.   . Pulmonary fibrosis Mother        passed away from this  . Diabetes Father   . Diabetes Brother   . Hypertension Brother   . Diabetes Brother   . Hypertension Brother   . Lymphoma Brother   . Pulmonary fibrosis Maternal Grandfather     Social History   Socioeconomic History  . Marital status: Divorced    Spouse name: Not on file  . Number of children: Not on file  . Years of education: Not on file  . Highest education level: Not on file  Occupational History  . Occupation: Pharmacist, hospital    Comment: [place]  Social Needs  . Financial resource strain: Not on file  . Food insecurity    Worry: Not on file    Inability: Not on file  . Transportation needs    Medical:  Not on file    Non-medical: Not on file  Tobacco Use  . Smoking status: Never Smoker  . Smokeless tobacco: Never Used  Substance and Sexual Activity  . Alcohol use: Not on file  . Drug use: Not on file  . Sexual activity: Never  Lifestyle  . Physical activity    Days per week: Not on file    Minutes per session: Not on file  . Stress: Not on file  Relationships  . Social Herbalist on phone: Not on file    Gets together: Not on file    Attends religious service: Not on file    Active member of club or organization: Not on file    Attends meetings of clubs or organizations: Not on file    Relationship status: Not on file  . Intimate partner violence    Fear of current or ex partner: Not on file    Emotionally abused: Not on file    Physically abused: Not on file    Forced sexual activity: Not on file  Other Topics Concern  . Not on file  Social History Narrative   1-2 caffeine drinks a day.  No  regular exercise.      Outpatient Medications Prior to Visit  Medication Sig Dispense Refill  . Cholecalciferol (D3-1000 PO) Take by mouth daily.     . Cyanocobalamin (VITAMIN B-12 CR PO) Take 1 tablet by mouth daily.     . fish oil-omega-3 fatty acids 1000 MG capsule Take 2 g by mouth daily.     [name] losartan (COZAAR) 100 MG tablet Take 1 tablet by mouth once daily 90 tablet 0  . Multiple Vitamin (MULTIVITAMIN) tablet Take 1 tablet by mouth daily.    . vitamin C (ASCORBIC ACID) 500 MG tablet Take 500 mg by mouth daily.    . AMBULATORY NON FORMULARY MEDICATION Medication Name: Viactive Calcium with D    . cycloSPORINE (RESTASIS) 0.05 % ophthalmic emulsion 1 drop 2 (two) times daily.    [name] sulfamethoxazole-trimethoprim (BACTRIM DS,SEPTRA DS) 800-160 MG tablet Take 1 tablet by mouth 2 (two) times daily. 20 tablet 0   No facility-administered medications prior to visit.     Allergies  Allergen Reactions  . Ace Inhibitors     REACTION: cough    ROS     Objective:     Physical Exam  Constitutional: She is oriented to person, place, and time. She appears well-developed and well-nourished.  HENT:  Head: Normocephalic and atraumatic.  Cardiovascular: Normal rate, regular rhythm and normal heart sounds.  Pulmonary/Chest: Effort normal and breath sounds normal. No respiratory distress. She has no wheezes.  She has an inspiratory squeak close to the end of inspiration bilaterally at the bases.  I had her take multiple deep breaths to see if it went away but it did not.  Neurological: She is alert and oriented to person, place, and time.  Skin: Skin is warm and dry.  Lesion on her left mid abdomen is consistent with a seborrheic keratosis.  The lesion on her right forearm is consistent with a solar lentigo.  Psychiatric: She has a normal mood and affect. Her behavior is normal.    BP 108/62   Pulse 80   Ht 5\' 3"  (1.6 m)   Wt 201 lb (91.2 kg)   LMP 10/16/2010   SpO2 99%   BMI 35.61 kg/m  Wt Readings from Last 3 Encounters:  12/07/18 201 lb (91.2 kg)  06/01/18 215 lb (97.5 kg)  10/26/17 210 lb (95.3 kg)    Health Maintenance Due  Topic Date Due  . HIV Screening  02/21/1970    There are no preventive care reminders to display for this patient.   Lab Results  Component Value Date   TSH 2.172 01/24/2014   Lab Results  Component Value Date   WBC 4.3 11/11/2016   HGB 13.5 11/11/2016   HCT 40.6 11/11/2016   MCV 84.9 11/11/2016   PLT 166 11/11/2016   Lab Results  Component Value Date   NA 138 06/05/2018   K 4.1 06/05/2018   CO2 25 06/05/2018   GLUCOSE 86 06/05/2018   BUN 25 06/05/2018   CREATININE 0.89 06/05/2018   BILITOT 0.8 10/26/2017   ALKPHOS 68 11/11/2016   AST 23 10/26/2017   ALT 22 10/26/2017   PROT 7.0 10/26/2017   ALBUMIN 4.2 11/11/2016   CALCIUM 9.5 06/05/2018   Lab Results  Component Value Date   CHOL 188 10/26/2017   Lab Results  Component Value Date   HDL 57 10/26/2017   Lab Results  Component Value Date    LDLCALC 108 (H) 10/26/2017   Lab Results  Component Value  Date   TRIG 118 10/26/2017   Lab Results  Component Value Date   CHOLHDL 3.3 10/26/2017   No results found for: HGBA1C     Assessment & Plan:   Problem List Items Addressed This Visit      Cardiovascular and Mediastinum   HYPERTENSION, BENIGN ESSENTIAL - Primary    Well-controlled.  Continue current regimen.  Due for updated lab work.  Labs pended and printed.      Relevant Orders   COMPLETE METABOLIC PANEL WITH GFR   Lipid panel   CBC    Other Visit Diagnoses    Abnormal lung sounds       Relevant Orders   DG Chest 2 View      REcommended her to schedule her mammogram.  Abnormal lung sounds-recommend chest x-ray.  Last one in the system was about 8 years ago.  She has a normal pulse ox and denies any shortness of breath but she does have persistently abnormal lung sounds.  No orders of the defined types were placed in this encounter.    Beatrice Lecher, MD

## 2018-12-11 DIAGNOSIS — I10 Essential (primary) hypertension: Secondary | ICD-10-CM | POA: Diagnosis not present

## 2018-12-12 LAB — CBC
HCT: 40.1 % (ref 35.0–45.0)
Hemoglobin: 13.6 g/dL (ref 11.7–15.5)
MCH: 28 pg (ref 27.0–33.0)
MCHC: 33.9 g/dL (ref 32.0–36.0)
MCV: 82.5 fL (ref 80.0–100.0)
MPV: 10.1 fL (ref 7.5–12.5)
Platelets: 207 10*3/uL (ref 140–400)
RBC: 4.86 10*6/uL (ref 3.80–5.10)
RDW: 13.3 % (ref 11.0–15.0)
WBC: 5.1 10*3/uL (ref 3.8–10.8)

## 2018-12-12 LAB — COMPLETE METABOLIC PANEL WITH GFR
AG Ratio: 1.6 (calc) (ref 1.0–2.5)
ALT: 14 U/L (ref 6–29)
AST: 17 U/L (ref 10–35)
Albumin: 4.2 g/dL (ref 3.6–5.1)
Alkaline phosphatase (APISO): 72 U/L (ref 37–153)
BUN: 24 mg/dL (ref 7–25)
CO2: 27 mmol/L (ref 20–32)
Calcium: 9.8 mg/dL (ref 8.6–10.4)
Chloride: 104 mmol/L (ref 98–110)
Creat: 0.84 mg/dL (ref 0.50–0.99)
GFR, Est African American: 86 mL/min/{1.73_m2} (ref >=60)
GFR, Est Non African American: 74 mL/min/{1.73_m2} (ref >=60)
Globulin: 2.7 g/dL (calc) (ref 1.9–3.7)
Glucose, Bld: 89 mg/dL (ref 65–99)
Potassium: 4.4 mmol/L (ref 3.5–5.3)
Sodium: 140 mmol/L (ref 135–146)
Total Bilirubin: 0.4 mg/dL (ref 0.2–1.2)
Total Protein: 6.9 g/dL (ref 6.1–8.1)

## 2018-12-12 LAB — LIPID PANEL
Cholesterol: 169 mg/dL (ref <200)
HDL: 51 mg/dL (ref >=50)
LDL Cholesterol (Calc): 98 mg/dL (calc)
Non-HDL Cholesterol (Calc): 118 mg/dL (calc) (ref <130)
Total CHOL/HDL Ratio: 3.3 (calc) (ref <5.0)
Triglycerides: 104 mg/dL (ref <150)

## 2018-12-12 NOTE — Progress Notes (Signed)
All labs are normal. 

## 2019-02-25 ENCOUNTER — Other Ambulatory Visit: Payer: Self-pay | Admitting: Family Medicine

## 2019-02-25 DIAGNOSIS — I10 Essential (primary) hypertension: Secondary | ICD-10-CM

## 2019-06-12 ENCOUNTER — Other Ambulatory Visit: Payer: Self-pay | Admitting: Family Medicine

## 2019-06-12 DIAGNOSIS — I10 Essential (primary) hypertension: Secondary | ICD-10-CM

## 2019-07-06 ENCOUNTER — Encounter: Payer: Self-pay | Admitting: Family Medicine

## 2019-07-06 ENCOUNTER — Other Ambulatory Visit: Payer: Self-pay

## 2019-07-06 ENCOUNTER — Ambulatory Visit: Payer: BC Managed Care – PPO | Admitting: Family Medicine

## 2019-07-06 VITALS — BP 138/82 | HR 81 | Temp 98.0°F | Ht 63.0 in | Wt 200.0 lb

## 2019-07-06 DIAGNOSIS — R300 Dysuria: Secondary | ICD-10-CM

## 2019-07-06 DIAGNOSIS — N3090 Cystitis, unspecified without hematuria: Secondary | ICD-10-CM | POA: Diagnosis not present

## 2019-07-06 DIAGNOSIS — R350 Frequency of micturition: Secondary | ICD-10-CM | POA: Diagnosis not present

## 2019-07-06 DIAGNOSIS — R319 Hematuria, unspecified: Secondary | ICD-10-CM | POA: Diagnosis not present

## 2019-07-06 LAB — POCT URINALYSIS DIP (CLINITEK)
Bilirubin, UA: NEGATIVE
Glucose, UA: NEGATIVE mg/dL
Ketones, POC UA: NEGATIVE mg/dL
Nitrite, UA: NEGATIVE
POC PROTEIN,UA: NEGATIVE
Spec Grav, UA: 1.015 (ref 1.010–1.025)
Urobilinogen, UA: 0.2 E.U./dL
pH, UA: 5.5 (ref 5.0–8.0)

## 2019-07-06 MED ORDER — CEFPODOXIME PROXETIL 100 MG PO TABS: 100.0000 mg | ORAL_TABLET | Freq: Two times a day (BID) | ORAL | 0 refills | Status: AC

## 2019-07-06 NOTE — Assessment & Plan Note (Signed)
Symptoms and UA consistent with UTI.  Will cover with cefpodoxime 100mg  BID x5 days.  Urine sent for culture and make any adjustments to antibiotics if needed based on results.  She is instructed to call for any new or worsening symptoms.

## 2019-07-06 NOTE — Patient Instructions (Signed)
Urinary Tract Infection, Adult A urinary tract infection (UTI) is an infection of any part of the urinary tract. The urinary tract includes:  The kidneys.  The ureters.  The bladder.  The urethra. These organs make, store, and get rid of pee (urine) in the body. What are the causes? This is caused by germs (bacteria) in your genital area. These germs grow and cause swelling (inflammation) of your urinary tract. What increases the risk? You are more likely to develop this condition if:  You have a small, thin tube (catheter) to drain pee.  You cannot control when you pee or poop (incontinence).  You are female, and: ? You use these methods to prevent pregnancy:  A medicine that kills sperm (spermicide).  A device that blocks sperm (diaphragm). ? You have low levels of a female hormone (estrogen). ? You are pregnant.  You have genes that add to your risk.  You are sexually active.  You take antibiotic medicines.  You have trouble peeing because of: ? A prostate that is bigger than normal, if you are female. ? A blockage in the part of your body that drains pee from the bladder (urethra). ? A kidney stone. ? A nerve condition that affects your bladder (neurogenic bladder). ? Not getting enough to drink. ? Not peeing often enough.  You have other conditions, such as: ? Diabetes. ? A weak disease-fighting system (immune system). ? Sickle cell disease. ? Gout. ? Injury of the spine. What are the signs or symptoms? Symptoms of this condition include:  Needing to pee right away (urgently).  Peeing often.  Peeing small amounts often.  Pain or burning when peeing.  Blood in the pee.  Pee that smells bad or not like normal.  Trouble peeing.  Pee that is cloudy.  Fluid coming from the vagina, if you are female.  Pain in the belly or lower back. Other symptoms include:  Throwing up (vomiting).  No urge to eat.  Feeling mixed up (confused).  Being tired  and grouchy (irritable).  A fever.  Watery poop (diarrhea). How is this treated? This condition may be treated with:  Antibiotic medicine.  Other medicines.  Drinking enough water. Follow these instructions at home:  Medicines  Take over-the-counter and prescription medicines only as told by your doctor.  If you were prescribed an antibiotic medicine, take it as told by your doctor. Do not stop taking it even if you start to feel better. General instructions  Make sure you: ? Pee until your bladder is empty. ? Do not hold pee for a long time. ? Empty your bladder after sex. ? Wipe from front to back after pooping if you are a female. Use each tissue one time when you wipe.  Drink enough fluid to keep your pee pale yellow.  Keep all follow-up visits as told by your doctor. This is important. Contact a doctor if:  You do not get better after 1-2 days.  Your symptoms go away and then come back. Get help right away if:  You have very bad back pain.  You have very bad pain in your lower belly.  You have a fever.  You are sick to your stomach (nauseous).  You are throwing up. Summary  A urinary tract infection (UTI) is an infection of any part of the urinary tract.  This condition is caused by germs in your genital area.  There are many risk factors for a UTI. These include having a small, thin   tube to drain pee and not being able to control when you pee or poop.  Treatment includes antibiotic medicines for germs.  Drink enough fluid to keep your pee pale yellow. This information is not intended to replace advice given to you by your health care provider. Make sure you discuss any questions you have with your health care provider. Document Revised: 04/13/2018 Document Reviewed: 11/03/2017 Elsevier Patient Education  2020 Elsevier Inc.  

## 2019-07-06 NOTE — Progress Notes (Signed)
Emily Mcdonald - 64 y.o. female MRN [id number]  Date of birth: 08/25/1955  Subjective Chief Complaint  Patient presents with  . Urinary Tract Infection    HPI Emily Mcdonald is a 64 y.o. female here today for acute visit with complaint of UTI symptoms.  She reports symptoms of urinary frequency, dysuria, urgency and hematuria.  This is similar to previous UTI's she has had in the past.  Typically able to manage with increased fluids and cranberry juice but has not had much improvement with this for her current symptoms.  She denies flank pain, fever, chills, nausea or vomiting or vaginal discharge.   ROS:  A comprehensive ROS was completed and negative except as noted per HPI  Allergies  Allergen Reactions  . Ace Inhibitors     REACTION: cough    Past Medical History:  Diagnosis Date  . Arthritis 03-31-11   arthritis fingers, and toes more right sided  . Cancer [facility]) 03-31-11   dx. endometrial cancer , surgery planned  . Hypertension 03-31-11   tx. med  . Obesity     Past Surgical History:  Procedure Laterality Date  . ABDOMINAL HYSTERECTOMY  04/06/11   Robotic BSO, LND    Social History   Socioeconomic History  . Marital status: Divorced    Spouse name: Not on file  . Number of children: Not on file  . Years of education: Not on file  . Highest education level: Not on file  Occupational History  . Occupation: Pharmacist, hospital    Comment: [place]  Tobacco Use  . Smoking status: Never Smoker  . Smokeless tobacco: Never Used  Substance and Sexual Activity  . Alcohol use: Not on file  . Drug use: Not on file  . Sexual activity: Never  Other Topics Concern  . Not on file  Social History Narrative   1-2 caffeine drinks a day.  No regular exercise.     Social Determinants of Health   Financial Resource Strain:   . Difficulty of Paying Living Expenses: Not on file  Food Insecurity:   . Worried About Charity fundraiser in the Last Year: Not on file  .  Ran Out of Food in the Last Year: Not on file  Transportation Needs:   . Lack of Transportation (Medical): Not on file  . Lack of Transportation (Non-Medical): Not on file  Physical Activity:   . Days of Exercise per Week: Not on file  . Minutes of Exercise per Session: Not on file  Stress:   . Feeling of Stress : Not on file  Social Connections:   . Frequency of Communication with Friends and Family: Not on file  . Frequency of Social Gatherings with Friends and Family: Not on file  . Attends Religious Services: Not on file  . Active Member of Clubs or Organizations: Not on file  . Attends Archivist Meetings: Not on file  . Marital Status: Not on file    Family History  Problem Relation Age of Onset  . Breast cancer Maternal Grandmother   . Lymphoma Paternal Grandmother 51  . Heart attack Mother 33  . Diabetes Mother        late 59s.   . Pulmonary fibrosis Mother        passed away from this  . Diabetes Father   . Diabetes Brother   . Hypertension Brother   . Diabetes Brother   . Hypertension Brother   . Lymphoma Brother   .  Pulmonary fibrosis Maternal Grandfather     Health Maintenance  Topic Date Due  . HIV Screening  02/21/1970  . MAMMOGRAM  10/27/2019  . TETANUS/TDAP  09/29/2024  . COLONOSCOPY  12/03/2026  . INFLUENZA VACCINE  Completed  . Hepatitis C Screening  Completed     ----------------------------------------------------------------------------------------------------------------------------------------------------------------------------------------------------------------- Physical Exam BP 138/82   Pulse 81   Temp 98 F (36.7 C) (Oral)   Ht 5\' 3"  (1.6 m)   Wt 200 lb (90.7 kg)   LMP 10/16/2010   BMI 35.43 kg/m   Physical Exam Constitutional:      Appearance: Normal appearance.  HENT:     Head: Normocephalic and atraumatic.  Eyes:     General: No scleral icterus. Cardiovascular:     Rate and Rhythm: Normal rate and regular  rhythm.  Pulmonary:     Effort: Pulmonary effort is normal.     Breath sounds: Normal breath sounds.  Abdominal:     Tenderness: There is no right CVA tenderness or left CVA tenderness.  Skin:    General: Skin is warm and dry.  Neurological:     General: No focal deficit present.     Mental Status: She is alert.  Psychiatric:        Mood and Affect: Mood normal.        Behavior: Behavior normal.     ------------------------------------------------------------------------------------------------------------------------------------------------------------------------------------------------------------------- Assessment and Plan  Cystitis Symptoms and UA consistent with UTI.  Will cover with cefpodoxime 100mg  BID x5 days.  Urine sent for culture and make any adjustments to antibiotics if needed based on results.  She is instructed to call for any new or worsening symptoms.    Meds ordered this encounter  Medications  . cefpodoxime (VANTIN) 100 MG tablet    Sig: Take 1 tablet (100 mg total) by mouth 2 (two) times daily for 5 days.    Dispense:  10 tablet    Refill:  0    No follow-ups on file.    This visit occurred during the SARS-CoV-2 public health emergency.  Safety protocols were in place, including screening questions prior to the visit, additional usage of staff PPE, and extensive cleaning of exam room while observing appropriate contact time as indicated for disinfecting solutions.

## 2019-07-08 LAB — URINE CULTURE
MICRO NUMBER:: 10194480
SPECIMEN QUALITY:: ADEQUATE

## 2019-07-23 ENCOUNTER — Encounter: Payer: Self-pay | Admitting: Family Medicine

## 2019-07-23 ENCOUNTER — Other Ambulatory Visit: Payer: Self-pay

## 2019-07-23 ENCOUNTER — Ambulatory Visit: Payer: BC Managed Care – PPO | Admitting: Family Medicine

## 2019-07-23 VITALS — BP 125/65 | HR 72 | Ht 63.0 in | Wt 209.0 lb

## 2019-07-23 DIAGNOSIS — I10 Essential (primary) hypertension: Secondary | ICD-10-CM

## 2019-07-23 DIAGNOSIS — Z23 Encounter for immunization: Secondary | ICD-10-CM

## 2019-07-23 DIAGNOSIS — Z833 Family history of diabetes mellitus: Secondary | ICD-10-CM

## 2019-07-23 MED ORDER — LOSARTAN POTASSIUM 100 MG PO TABS: 100.0000 mg | ORAL_TABLET | Freq: Every day | ORAL | 1 refills | Status: DC

## 2019-07-23 NOTE — Assessment & Plan Note (Signed)
Well controlled. Continue current regimen. Follow up in  6 months.  

## 2019-07-23 NOTE — Progress Notes (Signed)
Established Patient Office Visit  Subjective:  Patient ID: Emily Mcdonald, female    DOB: 1955-12-25  Age: 64 y.o. MRN: [id number]  CC:  Chief Complaint  Patient presents with  . Hypertension    HPI FEY DORSI presents for   Hypertension- Pt denies chest pain, SOB, dizziness, or heart palpitations.  Taking meds as directed w/o problems.  Denies medication side effects.    Needs last Hep B vaccine.  Had first 2 at [facility].  Would like to get the third 1 today if possible.   Past Medical History:  Diagnosis Date  . Arthritis 03-31-11   arthritis fingers, and toes more right sided  . Cancer [facility]) 03-31-11   dx. endometrial cancer , surgery planned  . Hypertension 03-31-11   tx. med  . Obesity     Past Surgical History:  Procedure Laterality Date  . ABDOMINAL HYSTERECTOMY  04/06/11   Robotic BSO, LND    Family History  Problem Relation Age of Onset  . Breast cancer Maternal Grandmother   . Lymphoma Paternal Grandmother 77  . Heart attack Mother 51  . Diabetes Mother        late 70s.   . Pulmonary fibrosis Mother        passed away from this  . Diabetes Father   . Diabetes Brother   . Hypertension Brother   . Diabetes Brother   . Hypertension Brother   . Lymphoma Brother   . Pulmonary fibrosis Maternal Grandfather     Social History   Socioeconomic History  . Marital status: Divorced    Spouse name: Not on file  . Number of children: Not on file  . Years of education: Not on file  . Highest education level: Not on file  Occupational History  . Occupation: Pharmacist, hospital    Comment: [place]  Tobacco Use  . Smoking status: Never Smoker  . Smokeless tobacco: Never Used  Substance and Sexual Activity  . Alcohol use: Not on file  . Drug use: Not on file  . Sexual activity: Never  Other Topics Concern  . Not on file  Social History Narrative   1-2 caffeine drinks a day.  No regular exercise.     Social Determinants of Health    Financial Resource Strain:   . Difficulty of Paying Living Expenses:   Food Insecurity:   . Worried About Charity fundraiser in the Last Year:   . Arboriculturist in the Last Year:   Transportation Needs:   . Film/video editor (Medical):   [name] Lack of Transportation (Non-Medical):   Physical Activity:   . Days of Exercise per Week:   . Minutes of Exercise per Session:   Stress:   . Feeling of Stress :   Social Connections:   . Frequency of Communication with Friends and Family:   . Frequency of Social Gatherings with Friends and Family:   . Attends Religious Services:   . Active Member of Clubs or Organizations:   . Attends Archivist Meetings:   [name] Marital Status:   Intimate Partner Violence:   . Fear of Current or Ex-Partner:   . Emotionally Abused:   [name] Physically Abused:   . Sexually Abused:     Outpatient Medications Prior to Visit  Medication Sig Dispense Refill  . Cholecalciferol (D3-1000 PO) Take by mouth daily.     . Cyanocobalamin (VITAMIN B-12 CR PO) Take 1 tablet by mouth daily.     [name]  fish oil-omega-3 fatty acids 1000 MG capsule Take 2 g by mouth daily.     . Multiple Vitamin (MULTIVITAMIN) tablet Take 1 tablet by mouth daily.    . vitamin C (ASCORBIC ACID) 500 MG tablet Take 500 mg by mouth daily.    [name] losartan (COZAAR) 100 MG tablet Take 1 tablet (100 mg total) by mouth daily. [place].MUST SCHEDULE AN APPOINTMENT FOR REFILLS 30 tablet 0  . AMBULATORY NON FORMULARY MEDICATION Medication Name: Viactive Calcium with D     No facility-administered medications prior to visit.    Allergies  Allergen Reactions  . Ace Inhibitors     REACTION: cough    ROS Review of Systems    Objective:    Physical Exam  Constitutional: She is oriented to person, place, and time. She appears well-developed and well-nourished.  HENT:  Head: Normocephalic and atraumatic.  Neck: No thyromegaly present.  Cardiovascular: Normal rate, regular rhythm  and normal heart sounds.  Pulmonary/Chest: Effort normal and breath sounds normal.  Musculoskeletal:     Cervical back: Neck supple.  Lymphadenopathy:    She has no cervical adenopathy.  Neurological: She is alert and oriented to person, place, and time.  Skin: Skin is warm and dry.  Psychiatric: She has a normal mood and affect. Her behavior is normal.    BP 125/65   Pulse 72   Ht 5\' 3"  (1.6 m)   Wt 209 lb (94.8 kg)   LMP 10/16/2010   SpO2 100%   BMI 37.02 kg/m  Wt Readings from Last 3 Encounters:  07/23/19 209 lb (94.8 kg)  07/06/19 200 lb (90.7 kg)  12/07/18 201 lb (91.2 kg)     Health Maintenance Due  Topic Date Due  . HIV Screening  Never done    There are no preventive care reminders to display for this patient.  Lab Results  Component Value Date   TSH 2.172 01/24/2014   Lab Results  Component Value Date   WBC 5.1 12/11/2018   HGB 13.6 12/11/2018   HCT 40.1 12/11/2018   MCV 82.5 12/11/2018   PLT 207 12/11/2018   Lab Results  Component Value Date   NA 140 07/23/2019   K 4.4 07/23/2019   CO2 29 07/23/2019   GLUCOSE 87 07/23/2019   BUN 14 07/23/2019   CREATININE 0.72 07/23/2019   BILITOT 0.4 12/11/2018   ALKPHOS 68 11/11/2016   AST 17 12/11/2018   ALT 14 12/11/2018   PROT 6.9 12/11/2018   ALBUMIN 4.2 11/11/2016   CALCIUM 9.4 07/23/2019   Lab Results  Component Value Date   CHOL 169 12/11/2018   Lab Results  Component Value Date   HDL 51 12/11/2018   Lab Results  Component Value Date   LDLCALC 98 12/11/2018   Lab Results  Component Value Date   TRIG 104 12/11/2018   Lab Results  Component Value Date   CHOLHDL 3.3 12/11/2018   Lab Results  Component Value Date   HGBA1C 5.4 07/23/2019      Assessment & Plan:   Problem List Items Addressed This Visit      Cardiovascular and Mediastinum   HYPERTENSION, BENIGN ESSENTIAL    Well controlled. Continue current regimen. Follow up in  6 months.       Relevant Medications    losartan (COZAAR) 100 MG tablet   Other Relevant Orders   BASIC METABOLIC PANEL WITH GFR (Completed)   Hemoglobin A1c (Completed)    Other Visit Diagnoses  Family history of diabetes mellitus    -  Primary   Relevant Orders   Hemoglobin A1c (Completed)   Need for hepatitis B vaccination       Relevant Orders   Hepatitis B vaccine adult IM (Completed)     Encouraged to schedule her mammogram.  We will complete hepatitis B series here today.  Just needs to wait 2 weeks before starting her Covid vaccination series.  Meds ordered this encounter  Medications  . losartan (COZAAR) 100 MG tablet    Sig: Take 1 tablet (100 mg total) by mouth daily.    Dispense:  90 tablet    Refill:  1    Follow-up: Return in about 6 months (around 01/23/2020) for bp.    Beatrice Lecher, MD

## 2019-07-24 LAB — BASIC METABOLIC PANEL WITH GFR
BUN: 14 mg/dL (ref 7–25)
CO2: 29 mmol/L (ref 20–32)
Calcium: 9.4 mg/dL (ref 8.6–10.4)
Chloride: 103 mmol/L (ref 98–110)
Creat: 0.72 mg/dL (ref 0.50–0.99)
GFR, Est African American: 103 mL/min/{1.73_m2} (ref >=60)
GFR, Est Non African American: 88 mL/min/{1.73_m2} (ref >=60)
Glucose, Bld: 87 mg/dL (ref 65–139)
Potassium: 4.4 mmol/L (ref 3.5–5.3)
Sodium: 140 mmol/L (ref 135–146)

## 2019-07-24 LAB — HEMOGLOBIN A1C
Hgb A1c MFr Bld: 5.4 % of total Hgb (ref <5.7)
Mean Plasma Glucose: 108 (calc)
eAG (mmol/L): 6 (calc)

## 2019-07-24 NOTE — Progress Notes (Signed)
All labs are normal. 

## 2020-01-17 ENCOUNTER — Other Ambulatory Visit: Payer: Self-pay

## 2020-01-17 ENCOUNTER — Ambulatory Visit: Payer: BC Managed Care – PPO | Admitting: Nurse Practitioner

## 2020-01-17 ENCOUNTER — Encounter: Payer: Self-pay | Admitting: Nurse Practitioner

## 2020-01-17 VITALS — BP 134/82 | HR 77 | Temp 98.1°F | Ht 63.0 in | Wt 209.0 lb

## 2020-01-17 DIAGNOSIS — Z0189 Encounter for other specified special examinations: Secondary | ICD-10-CM | POA: Diagnosis not present

## 2020-01-17 DIAGNOSIS — I10 Essential (primary) hypertension: Secondary | ICD-10-CM

## 2020-01-17 DIAGNOSIS — Z23 Encounter for immunization: Secondary | ICD-10-CM

## 2020-01-17 MED ORDER — LOSARTAN POTASSIUM 100 MG PO TABS: 100.0000 mg | ORAL_TABLET | Freq: Every day | ORAL | 1 refills | Status: DC

## 2020-01-17 NOTE — Assessment & Plan Note (Signed)
Blood pressures well controlled on current regimen. We will obtain labs today to evaluate kidney function. Continue current regimen of losartan 100 mg once a day. Refills provided. Follow-up in approximately 6 months or sooner if needed.

## 2020-01-17 NOTE — Patient Instructions (Addendum)
Hypertension, Adult High blood pressure (hypertension) is when the force of blood pumping through the arteries is too strong. The arteries are the blood vessels that carry blood from the heart throughout the body. Hypertension forces the heart to work harder to pump blood and may cause arteries to become narrow or stiff. Untreated or uncontrolled hypertension can cause a heart attack, heart failure, a stroke, kidney disease, and other problems. A blood pressure reading consists of a higher number over a lower number. Ideally, your blood pressure should be below 120/80. The first ("top") number is called the systolic pressure. It is a measure of the pressure in your arteries as your heart beats. The second ("bottom") number is called the diastolic pressure. It is a measure of the pressure in your arteries as the heart relaxes. What are the causes? The exact cause of this condition is not known. There are some conditions that result in or are related to high blood pressure. What increases the risk? Some risk factors for high blood pressure are under your control. The following factors may make you more likely to develop this condition:  Smoking.  Having type 2 diabetes mellitus, high cholesterol, or both.  Not getting enough exercise or physical activity.  Being overweight.  Having too much fat, sugar, calories, or salt (sodium) in your diet.  Drinking too much alcohol. Some risk factors for high blood pressure may be difficult or impossible to change. Some of these factors include:  Having chronic kidney disease.  Having a family history of high blood pressure.  Age. Risk increases with age.  Race. You may be at higher risk if you are African American.  Gender. Men are at higher risk than women before age 45. After age 65, women are at higher risk than men.  Having obstructive sleep apnea.  Stress. What are the signs or symptoms? High blood pressure may not cause symptoms. Very high  blood pressure (hypertensive crisis) may cause:  Headache.  Anxiety.  Shortness of breath.  Nosebleed.  Nausea and vomiting.  Vision changes.  Severe chest pain.  Seizures. How is this diagnosed? This condition is diagnosed by measuring your blood pressure while you are seated, with your arm resting on a flat surface, your legs uncrossed, and your feet flat on the floor. The cuff of the blood pressure monitor will be placed directly against the skin of your upper arm at the level of your heart. It should be measured at least twice using the same arm. Certain conditions can cause a difference in blood pressure between your right and left arms. Certain factors can cause blood pressure readings to be lower or higher than normal for a short period of time:  When your blood pressure is higher when you are in a health care provider's office than when you are at home, this is called white coat hypertension. Most people with this condition do not need medicines.  When your blood pressure is higher at home than when you are in a health care provider's office, this is called masked hypertension. Most people with this condition may need medicines to control blood pressure. If you have a high blood pressure reading during one visit or you have normal blood pressure with other risk factors, you may be asked to:  Return on a different day to have your blood pressure checked again.  Monitor your blood pressure at home for 1 week or longer. If you are diagnosed with hypertension, you may have other blood or   imaging tests to help your health care provider understand your overall risk for other conditions. How is this treated? This condition is treated by making healthy lifestyle changes, such as eating healthy foods, exercising more, and reducing your alcohol intake. Your health care provider may prescribe medicine if lifestyle changes are not enough to get your blood pressure under control, and  if:  Your systolic blood pressure is above 130.  Your diastolic blood pressure is above 80. Your personal target blood pressure may vary depending on your medical conditions, your age, and other factors. Follow these instructions at home: Eating and drinking   Eat a diet that is high in fiber and potassium, and low in sodium, added sugar, and fat. An example eating plan is called the DASH (Dietary Approaches to Stop Hypertension) diet. To eat this way: ? Eat plenty of fresh fruits and vegetables. Try to fill one half of your plate at each meal with fruits and vegetables. ? Eat whole grains, such as whole-wheat pasta, brown rice, or whole-grain bread. Fill about one fourth of your plate with whole grains. ? Eat or drink low-fat dairy products, such as skim milk or low-fat yogurt. ? Avoid fatty cuts of meat, processed or cured meats, and poultry with skin. Fill about one fourth of your plate with lean proteins, such as fish, chicken without skin, beans, eggs, or tofu. ? Avoid pre-made and processed foods. These tend to be higher in sodium, added sugar, and fat.  Reduce your daily sodium intake. Most people with hypertension should eat less than 1,500 mg of sodium a day.  Do not drink alcohol if: ? Your health care provider tells you not to drink. ? You are pregnant, may be pregnant, or are planning to become pregnant.  If you drink alcohol: ? Limit how much you use to:  0-1 drink a day for women.  0-2 drinks a day for men. ? Be aware of how much alcohol is in your drink. In the [place] one drink equals one 12 oz bottle of beer (355 mL), one 5 oz glass of wine (148 mL), or one 1 oz glass of hard liquor (44 mL). Lifestyle   Work with your health care provider to maintain a healthy body weight or to lose weight. Ask what an ideal weight is for you.  Get at least 30 minutes of exercise most days of the week. Activities may include walking, swimming, or biking.  Include exercise to  strengthen your muscles (resistance exercise), such as Pilates or lifting weights, as part of your weekly exercise routine. Try to do these types of exercises for 30 minutes at least 3 days a week.  Do not use any products that contain nicotine or tobacco, such as cigarettes, e-cigarettes, and chewing tobacco. If you need help quitting, ask your health care provider.  Monitor your blood pressure at home as told by your health care provider.  Keep all follow-up visits as told by your health care provider. This is important. Medicines  Take over-the-counter and prescription medicines only as told by your health care provider. Follow directions carefully. Blood pressure medicines must be taken as prescribed.  Do not skip doses of blood pressure medicine. Doing this puts you at risk for problems and can make the medicine less effective.  Ask your health care provider about side effects or reactions to medicines that you should watch for. Contact a health care provider if you:  Think you are having a reaction to a medicine you   are taking.  Have headaches that keep coming back (recurring).  Feel dizzy.  Have swelling in your ankles.  Have trouble with your vision. Get help right away if you:  Develop a severe headache or confusion.  Have unusual weakness or numbness.  Feel faint.  Have severe pain in your chest or abdomen.  Vomit repeatedly.  Have trouble breathing. Summary  Hypertension is when the force of blood pumping through your arteries is too strong. If this condition is not controlled, it may put you at risk for serious complications.  Your personal target blood pressure may vary depending on your medical conditions, your age, and other factors. For most people, a normal blood pressure is less than 120/80.  Hypertension is treated with lifestyle changes, medicines, or a combination of both. Lifestyle changes include losing weight, eating a healthy, low-sodium diet,  exercising more, and limiting alcohol. This information is not intended to replace advice given to you by your health care provider. Make sure you discuss any questions you have with your health care provider. Document Revised: 01/04/2018 Document Reviewed: 01/04/2018 Elsevier Patient Education  [place].   Influenza (Flu) Vaccine (Inactivated or Recombinant): What You Need to Know 1. Why get vaccinated? Influenza vaccine can prevent influenza (flu). Flu is a contagious disease that spreads around the [place] every year, usually between October and May. Anyone can get the flu, but it is more dangerous for some people. Infants and young children, people 66 years of age and older, pregnant women, and people with certain health conditions or a weakened immune system are at greatest risk of flu complications. Pneumonia, bronchitis, sinus infections and ear infections are examples of flu-related complications. If you have a medical condition, such as heart disease, cancer or diabetes, flu can make it worse. Flu can cause fever and chills, sore throat, muscle aches, fatigue, cough, headache, and runny or stuffy nose. Some people may have vomiting and diarrhea, though this is more common in children than adults. Each year thousands of people in the [place] die from flu, and many more are hospitalized. Flu vaccine prevents millions of illnesses and flu-related visits to the doctor each year. 2. Influenza vaccine CDC recommends everyone 52 months of age and older get vaccinated every flu season. Children 6 months through 70 years of age may need 2 doses during a single flu season. Everyone else needs only 1 dose each flu season. It takes about 2 weeks for protection to develop after vaccination. There are many flu viruses, and they are always changing. Each year a new flu vaccine is made to protect against three or four viruses that are likely to cause disease in the upcoming flu season.  Even when the vaccine doesn't exactly match these viruses, it may still provide some protection. Influenza vaccine does not cause flu. Influenza vaccine may be given at the same time as other vaccines. 3. Talk with your health care provider Tell your vaccine provider if the person getting the vaccine:  Has had an allergic reaction after a previous dose of influenza vaccine, or has any severe, life-threatening allergies.  Has ever had Guillain-Barr Syndrome (also called GBS). In some cases, your health care provider may decide to postpone influenza vaccination to a future visit. People with minor illnesses, such as a cold, may be vaccinated. People who are moderately or severely ill should usually wait until they recover before getting influenza vaccine. Your health care provider can give you more information. 4. Risks of  a vaccine reaction  Soreness, redness, and swelling where shot is given, fever, muscle aches, and headache can happen after influenza vaccine.  There may be a very small increased risk of Guillain-Barr Syndrome (GBS) after inactivated influenza vaccine (the flu shot). Young children who get the flu shot along with pneumococcal vaccine (PCV13), and/or DTaP vaccine at the same time might be slightly more likely to have a seizure caused by fever. Tell your health care provider if a child who is getting flu vaccine has ever had a seizure. People sometimes faint after medical procedures, including vaccination. Tell your provider if you feel dizzy or have vision changes or ringing in the ears. As with any medicine, there is a very remote chance of a vaccine causing a severe allergic reaction, other serious injury, or death. 5. What if there is a serious problem? An allergic reaction could occur after the vaccinated person leaves the clinic. If you see signs of a severe allergic reaction (hives, swelling of the face and throat, difficulty breathing, a fast heartbeat, dizziness, or  weakness), call 9-1-1 and get the person to the nearest hospital. For other signs that concern you, call your health care provider. Adverse reactions should be reported to the Vaccine Adverse Event Reporting System (VAERS). Your health care provider will usually file this report, or you can do it yourself. Visit the VAERS website at www.vaers.SamedayNews.es or call 3855672708.VAERS is only for reporting reactions, and VAERS staff do not give medical advice. 6. The National Vaccine Injury Compensation Program The [employer] Vaccine Injury Compensation Program (VICP) is a federal program that was created to compensate people who may have been injured by certain vaccines. Visit the VICP website at GoldCloset.com.ee or call 9590373157 to learn about the program and about filing a claim. There is a time limit to file a claim for compensation. 7. How can I learn more?  Ask your healthcare provider.  Call your local or state health department.  Contact the Centers for Disease Control and Prevention (CDC): ? Call 406-047-2448 (1-800-CDC-INFO) or ? Visit CDC's https://gibson.com/ Vaccine Information Statement (Interim) Inactivated Influenza Vaccine (12/22/2017) This information is not intended to replace advice given to you by your health care provider. Make sure you discuss any questions you have with your health care provider. Document Revised: 08/15/2018 Document Reviewed: 12/26/2017 Elsevier Patient Education  [place].

## 2020-01-17 NOTE — Progress Notes (Signed)
Established Patient Office Visit  Subjective:  Patient ID: Emily Mcdonald, female    DOB: May 20, 1955  Age: 64 y.o. MRN: [id number]  CC:  Chief Complaint  Patient presents with  . Hypertension    HPI Emily Mcdonald presents for follow-up for hypertension. She is currently taking losartan 100 mg daily. She denies any complaints or concerns today.  HYPERTENSION Hypertension status: stable  Satisfied with current treatment? yes Duration of hypertension: chronic BP monitoring frequency:  not checking BP medication side effects:  no Medication compliance: excellent compliance Aspirin: no Recurrent headaches: no Visual changes: no Palpitations: no Dyspnea: no Chest pain: no Lower extremity edema: no Dizzy/lightheaded: no  Past Medical History:  Diagnosis Date  . Arthritis 03-31-11   arthritis fingers, and toes more right sided  . Cancer [facility]) 03-31-11   dx. endometrial cancer , surgery planned  . Hypertension 03-31-11   tx. med  . Obesity     Past Surgical History:  Procedure Laterality Date  . ABDOMINAL HYSTERECTOMY  04/06/11   Robotic BSO, LND    Family History  Problem Relation Age of Onset  . Breast cancer Maternal Grandmother   . Lymphoma Paternal Grandmother 54  . Heart attack Mother 86  . Diabetes Mother        late 26s.   . Pulmonary fibrosis Mother        passed away from this  . Diabetes Father   . Diabetes Brother   . Hypertension Brother   . Diabetes Brother   . Hypertension Brother   . Lymphoma Brother   . Pulmonary fibrosis Maternal Grandfather     Social History   Socioeconomic History  . Marital status: Divorced    Spouse name: Not on file  . Number of children: Not on file  . Years of education: Not on file  . Highest education level: Not on file  Occupational History  . Occupation: Pharmacist, hospital    Comment: [place]  Tobacco Use  . Smoking status: Never Smoker  . Smokeless tobacco: Never Used  Substance and Sexual  Activity  . Alcohol use: Not on file  . Drug use: Not on file  . Sexual activity: Never  Other Topics Concern  . Not on file  Social History Narrative   1-2 caffeine drinks a day.  No regular exercise.     Social Determinants of Health   Financial Resource Strain:   . Difficulty of Paying Living Expenses: Not on file  Food Insecurity:   . Worried About Charity fundraiser in the Last Year: Not on file  . Ran Out of Food in the Last Year: Not on file  Transportation Needs:   . Lack of Transportation (Medical): Not on file  . Lack of Transportation (Non-Medical): Not on file  Physical Activity:   . Days of Exercise per Week: Not on file  . Minutes of Exercise per Session: Not on file  Stress:   . Feeling of Stress : Not on file  Social Connections:   . Frequency of Communication with Friends and Family: Not on file  . Frequency of Social Gatherings with Friends and Family: Not on file  . Attends Religious Services: Not on file  . Active Member of Clubs or Organizations: Not on file  . Attends Archivist Meetings: Not on file  . Marital Status: Not on file  Intimate Partner Violence:   . Fear of Current or Ex-Partner: Not on file  . Emotionally Abused:  Not on file  . Physically Abused: Not on file  . Sexually Abused: Not on file    Outpatient Medications Prior to Visit  Medication Sig Dispense Refill  . Cholecalciferol (D3-1000 PO) Take by mouth daily.     . Cyanocobalamin (VITAMIN B-12 CR PO) Take 1 tablet by mouth daily.     . fish oil-omega-3 fatty acids 1000 MG capsule Take 2 g by mouth daily.     . Multiple Vitamin (MULTIVITAMIN) tablet Take 1 tablet by mouth daily.    . vitamin C (ASCORBIC ACID) 500 MG tablet Take 500 mg by mouth daily.    Emily Mcdonald losartan (COZAAR) 100 MG tablet Take 1 tablet (100 mg total) by mouth daily. 90 tablet 1   No facility-administered medications prior to visit.    Allergies  Allergen Reactions  . Ace Inhibitors     REACTION:  cough      Objective:    Physical Exam Vitals and nursing note reviewed.  Constitutional:      Appearance: Normal appearance.  HENT:     Head: Normocephalic.  Eyes:     Extraocular Movements: Extraocular movements intact.     Conjunctiva/sclera: Conjunctivae normal.     Pupils: Pupils are equal, round, and reactive to light.  Neck:     Vascular: No carotid bruit.  Cardiovascular:     Rate and Rhythm: Normal rate and regular rhythm.     Pulses: Normal pulses.     Heart sounds: Normal heart sounds.  Pulmonary:     Effort: Pulmonary effort is normal.     Breath sounds: Normal breath sounds.  Abdominal:     General: Abdomen is flat. Bowel sounds are normal. There is no distension.     Palpations: Abdomen is soft.     Tenderness: There is no abdominal tenderness.  Musculoskeletal:        General: Normal range of motion.     Cervical back: Normal range of motion and neck supple.     Right lower leg: No edema.     Left lower leg: No edema.  Skin:    General: Skin is warm and dry.     Capillary Refill: Capillary refill takes less than 2 seconds.  Neurological:     General: No focal deficit present.     Mental Status: She is alert and oriented to person, place, and time.  Psychiatric:        Mood and Affect: Mood normal.        Behavior: Behavior normal.        Thought Content: Thought content normal.        Judgment: Judgment normal.     BP 134/82   Pulse 77   Temp 98.1 F (36.7 C) (Oral)   Ht 5\' 3"  (1.6 m)   Wt 209 lb (94.8 kg)   LMP 10/16/2010   SpO2 97%   BMI 37.02 kg/m  Wt Readings from Last 3 Encounters:  01/17/20 209 lb (94.8 kg)  07/23/19 209 lb (94.8 kg)  07/06/19 200 lb (90.7 kg)     There are no preventive care reminders to display for this patient.  There are no preventive care reminders to display for this patient.  Lab Results  Component Value Date   TSH 2.172 01/24/2014   Lab Results  Component Value Date   WBC 5.1 12/11/2018   HGB  13.6 12/11/2018   HCT 40.1 12/11/2018   MCV 82.5 12/11/2018   PLT 207 12/11/2018  Lab Results  Component Value Date   NA 140 07/23/2019   K 4.4 07/23/2019   CO2 29 07/23/2019   GLUCOSE 87 07/23/2019   BUN 14 07/23/2019   CREATININE 0.72 07/23/2019   BILITOT 0.4 12/11/2018   ALKPHOS 68 11/11/2016   AST 17 12/11/2018   ALT 14 12/11/2018   PROT 6.9 12/11/2018   ALBUMIN 4.2 11/11/2016   CALCIUM 9.4 07/23/2019   Lab Results  Component Value Date   CHOL 169 12/11/2018   Lab Results  Component Value Date   HDL 51 12/11/2018   Lab Results  Component Value Date   LDLCALC 98 12/11/2018   Lab Results  Component Value Date   TRIG 104 12/11/2018   Lab Results  Component Value Date   CHOLHDL 3.3 12/11/2018   Lab Results  Component Value Date   HGBA1C 5.4 07/23/2019      Assessment & Plan:   Problem List Items Addressed This Visit      Cardiovascular and Mediastinum   HYPERTENSION, BENIGN ESSENTIAL - Primary    Blood pressures well controlled on current regimen. We will obtain labs today to evaluate kidney function. Continue current regimen of losartan 100 mg once a day. Refills provided. Follow-up in approximately 6 months or sooner if needed.      Relevant Medications   losartan (COZAAR) 100 MG tablet   Other Relevant Orders   COMPLETE METABOLIC PANEL WITH GFR   Lipid Panel w/reflex Direct LDL    Other Visit Diagnoses    Encounter for laboratory test       Relevant Orders   COMPLETE METABOLIC PANEL WITH GFR   CBC with Differential/Platelet   Lipid Panel w/reflex Direct LDL   Need for influenza vaccination       Relevant Orders   Flu Vaccine QUAD 36+ mos IM (Completed)      Meds ordered this encounter  Medications  . losartan (COZAAR) 100 MG tablet    Sig: Take 1 tablet (100 mg total) by mouth daily.    Dispense:  90 tablet    Refill:  1    Follow-up: Return in about 6 months (around 07/16/2020) for HTN.    Orma Render, NP

## 2020-01-30 DIAGNOSIS — Z0189 Encounter for other specified special examinations: Secondary | ICD-10-CM | POA: Diagnosis not present

## 2020-01-30 DIAGNOSIS — I10 Essential (primary) hypertension: Secondary | ICD-10-CM | POA: Diagnosis not present

## 2020-01-30 LAB — COMPLETE METABOLIC PANEL WITH GFR
AG Ratio: 1.7 (calc) (ref 1.0–2.5)
ALT: 14 U/L (ref 6–29)
AST: 18 U/L (ref 10–35)
Albumin: 4.2 g/dL (ref 3.6–5.1)
Alkaline phosphatase (APISO): 67 U/L (ref 37–153)
BUN: 18 mg/dL (ref 7–25)
CO2: 30 mmol/L (ref 20–32)
Calcium: 9.3 mg/dL (ref 8.6–10.4)
Chloride: 103 mmol/L (ref 98–110)
Creat: 0.75 mg/dL (ref 0.50–0.99)
GFR, Est African American: 98 mL/min/{1.73_m2} (ref >=60)
GFR, Est Non African American: 84 mL/min/{1.73_m2} (ref >=60)
Globulin: 2.5 g/dL (calc) (ref 1.9–3.7)
Glucose, Bld: 84 mg/dL (ref 65–99)
Potassium: 4.4 mmol/L (ref 3.5–5.3)
Sodium: 140 mmol/L (ref 135–146)
Total Bilirubin: 0.6 mg/dL (ref 0.2–1.2)
Total Protein: 6.7 g/dL (ref 6.1–8.1)

## 2020-01-30 LAB — CBC WITH DIFFERENTIAL/PLATELET
Absolute Monocytes: 440 cells/uL (ref 200–950)
Basophils Absolute: 48 cells/uL (ref 0–200)
Basophils Relative: 0.9 %
Eosinophils Absolute: 101 cells/uL (ref 15–500)
Eosinophils Relative: 1.9 %
HCT: 40.2 % (ref 35.0–45.0)
Hemoglobin: 13.7 g/dL (ref 11.7–15.5)
Lymphs Abs: 880 cells/uL (ref 850–3900)
MCH: 28.5 pg (ref 27.0–33.0)
MCHC: 34.1 g/dL (ref 32.0–36.0)
MCV: 83.8 fL (ref 80.0–100.0)
MPV: 9.8 fL (ref 7.5–12.5)
Monocytes Relative: 8.3 %
Neutro Abs: 3832 cells/uL (ref 1500–7800)
Neutrophils Relative %: 72.3 %
Platelets: 175 10*3/uL (ref 140–400)
RBC: 4.8 10*6/uL (ref 3.80–5.10)
RDW: 13.1 % (ref 11.0–15.0)
Total Lymphocyte: 16.6 %
WBC: 5.3 10*3/uL (ref 3.8–10.8)

## 2020-01-30 LAB — LIPID PANEL W/REFLEX DIRECT LDL
Cholesterol: 173 mg/dL (ref <200)
HDL: 50 mg/dL (ref >=50)
LDL Cholesterol (Calc): 103 mg/dL (calc) — ABNORMAL HIGH
Non-HDL Cholesterol (Calc): 123 mg/dL (calc) (ref <130)
Total CHOL/HDL Ratio: 3.5 (calc) (ref <5.0)
Triglycerides: 106 mg/dL (ref <150)

## 2020-02-08 NOTE — Progress Notes (Signed)
Labs look good!   LDL cholesterol just slightly elevated. Work on lowering saturated fats and carbohydrates from diet.

## 2020-06-11 ENCOUNTER — Encounter: Payer: Self-pay | Admitting: Family Medicine

## 2020-07-17 ENCOUNTER — Ambulatory Visit (INDEPENDENT_AMBULATORY_CARE_PROVIDER_SITE_OTHER): Payer: Medicare Other | Admitting: Family Medicine

## 2020-07-17 ENCOUNTER — Encounter: Payer: Self-pay | Admitting: Family Medicine

## 2020-07-17 ENCOUNTER — Other Ambulatory Visit: Payer: Self-pay

## 2020-07-17 VITALS — BP 128/58 | HR 82 | Ht 63.0 in | Wt 210.0 lb

## 2020-07-17 DIAGNOSIS — I10 Essential (primary) hypertension: Secondary | ICD-10-CM

## 2020-07-17 DIAGNOSIS — E669 Obesity, unspecified: Secondary | ICD-10-CM

## 2020-07-17 DIAGNOSIS — Z1231 Encounter for screening mammogram for malignant neoplasm of breast: Secondary | ICD-10-CM | POA: Diagnosis not present

## 2020-07-17 DIAGNOSIS — Z780 Asymptomatic menopausal state: Secondary | ICD-10-CM | POA: Diagnosis not present

## 2020-07-17 MED ORDER — LOSARTAN POTASSIUM 100 MG PO TABS: 100.0000 mg | ORAL_TABLET | Freq: Every day | ORAL | 1 refills | Status: DC

## 2020-07-17 NOTE — Assessment & Plan Note (Addendum)
Well controlled. Continue current regimen. Follow up in  6 mo. she is due for some updated blood work and will try to get that done today.  Blood pressures look great so make sure she is got refills on the medication.  Did encourage her to start walking more regularly.

## 2020-07-17 NOTE — Progress Notes (Signed)
Established Patient Office Visit  Subjective:  Patient ID: Emily Mcdonald, female    DOB: 1955-11-06  Age: 65 y.o. MRN: [id number]  CC:  Chief Complaint  Patient presents with  . Hypertension    HPI SHONDREA STEINERT presents for    Hypertension- Pt denies chest pain, SOB, dizziness, or heart palpitations.  Taking meds as directed w/o problems.  Denies medication side effects.      Past Medical History:  Diagnosis Date  . Arthritis 03-31-11   arthritis fingers, and toes more right sided  . Cancer [facility]) 03-31-11   dx. endometrial cancer , surgery planned  . Hypertension 03-31-11   tx. med  . Obesity     Past Surgical History:  Procedure Laterality Date  . ABDOMINAL HYSTERECTOMY  04/06/11   Robotic BSO, LND  . TOTAL ABDOMINAL HYSTERECTOMY W/ BILATERAL SALPINGOOPHORECTOMY      Family History  Problem Relation Age of Onset  . Breast cancer Maternal Grandmother   . Lymphoma Paternal Grandmother 54  . Heart attack Mother 65  . Diabetes Mother        late 71s.   . Pulmonary fibrosis Mother        passed away from this  . Diabetes Father   . Diabetes Brother   . Hypertension Brother   . Diabetes Brother   . Hypertension Brother   . Lymphoma Brother   . Pulmonary fibrosis Maternal Grandfather     Social History   Socioeconomic History  . Marital status: Divorced    Spouse name: Not on file  . Number of children: Not on file  . Years of education: Not on file  . Highest education level: Not on file  Occupational History  . Occupation: Pharmacist, hospital    Comment: [place]  Tobacco Use  . Smoking status: Never Smoker  . Smokeless tobacco: Never Used  Substance and Sexual Activity  . Alcohol use: Not on file  . Drug use: Not on file  . Sexual activity: Never  Other Topics Concern  . Not on file  Social History Narrative   1-2 caffeine drinks a day.  No regular exercise.     Social Determinants of Health   Financial Resource Strain: Not on file   Food Insecurity: Not on file  Transportation Needs: Not on file  Physical Activity: Not on file  Stress: Not on file  Social Connections: Not on file  Intimate Partner Violence: Not on file    Outpatient Medications Prior to Visit  Medication Sig Dispense Refill  . Cholecalciferol (D3-1000 PO) Take by mouth daily.     . Cyanocobalamin (VITAMIN B-12 CR PO) Take 1 tablet by mouth daily.    . fish oil-omega-3 fatty acids 1000 MG capsule Take 2 g by mouth daily.    . Multiple Vitamin (MULTIVITAMIN) tablet Take 1 tablet by mouth daily.    . vitamin C (ASCORBIC ACID) 500 MG tablet Take 500 mg by mouth daily.    [name] losartan (COZAAR) 100 MG tablet Take 1 tablet (100 mg total) by mouth daily. 90 tablet 1   No facility-administered medications prior to visit.    Allergies  Allergen Reactions  . Ace Inhibitors     REACTION: cough    ROS Review of Systems    Objective:    Physical Exam Constitutional:      Appearance: She is well-developed.  HENT:     Head: Normocephalic and atraumatic.  Cardiovascular:     Rate and  Rhythm: Normal rate and regular rhythm.     Heart sounds: Normal heart sounds.  Pulmonary:     Effort: Pulmonary effort is normal.     Breath sounds: Normal breath sounds.  Skin:    General: Skin is warm and dry.  Neurological:     Mental Status: She is alert and oriented to person, place, and time.  Psychiatric:        Behavior: Behavior normal.     BP (!) 128/58   Pulse 82   Ht 5\' 3"  (1.6 m)   Wt 210 lb (95.3 kg)   LMP 10/16/2010   SpO2 100%   BMI 37.20 kg/m  Wt Readings from Last 3 Encounters:  07/17/20 210 lb (95.3 kg)  01/17/20 209 lb (94.8 kg)  07/23/19 209 lb (94.8 kg)     Health Maintenance Due  Topic Date Due  . HIV Screening  Never done  . MAMMOGRAM  10/27/2019  . DEXA SCAN  Never done    There are no preventive care reminders to display for this patient.  Lab Results  Component Value Date   TSH 2.172 01/24/2014   Lab Results   Component Value Date   WBC 5.3 01/30/2020   HGB 13.7 01/30/2020   HCT 40.2 01/30/2020   MCV 83.8 01/30/2020   PLT 175 01/30/2020   Lab Results  Component Value Date   NA 140 01/30/2020   K 4.4 01/30/2020   CO2 30 01/30/2020   GLUCOSE 84 01/30/2020   BUN 18 01/30/2020   CREATININE 0.75 01/30/2020   BILITOT 0.6 01/30/2020   ALKPHOS 68 11/11/2016   AST 18 01/30/2020   ALT 14 01/30/2020   PROT 6.7 01/30/2020   ALBUMIN 4.2 11/11/2016   CALCIUM 9.3 01/30/2020   Lab Results  Component Value Date   CHOL 173 01/30/2020   Lab Results  Component Value Date   HDL 50 01/30/2020   Lab Results  Component Value Date   LDLCALC 103 (H) 01/30/2020   Lab Results  Component Value Date   TRIG 106 01/30/2020   Lab Results  Component Value Date   CHOLHDL 3.5 01/30/2020   Lab Results  Component Value Date   HGBA1C 5.4 07/23/2019      Assessment & Plan:   Problem List Items Addressed This Visit      Cardiovascular and Mediastinum   HYPERTENSION, BENIGN ESSENTIAL    Well controlled. Continue current regimen. Follow up in  6 mo. she is due for some updated blood work and will try to get that done today.  Blood pressures look great so make sure she is got refills on the medication.  Did encourage her to start walking more regularly.      Relevant Medications   losartan (COZAAR) 100 MG tablet   Other Relevant Orders   BASIC METABOLIC PANEL WITH GFR     Other   Obesity (BMI 30-39.9)    She reports that she did put on some weight over the holidays.  Just encouraged her to get back on track with regular exercise to improve her weight as well as reduce her blood pressure.       Other Visit Diagnoses    Screening mammogram, encounter for    -  Primary   Relevant Orders   MM 3D SCREEN BREAST BILATERAL   Postmenopausal       Relevant Orders   DG Bone Density     Encouraged her to schedule her welcome to Medicare exam  she went on to [facility] in October and explained to her  that she has a year to have her for a welcome to Medicare exam which includes an EKG and then after that would schedule yearly with her Medicare wellness nurse.  Meds ordered this encounter  Medications  . losartan (COZAAR) 100 MG tablet    Sig: Take 1 tablet (100 mg total) by mouth daily.    Dispense:  90 tablet    Refill:  1    Follow-up: Return in about 4 months (around 11/16/2020) for Welcome to Medicare .    Beatrice Lecher, MD

## 2020-07-17 NOTE — Assessment & Plan Note (Signed)
She reports that she did put on some weight over the holidays.  Just encouraged her to get back on track with regular exercise to improve her weight as well as reduce her blood pressure.

## 2020-07-18 LAB — BASIC METABOLIC PANEL WITH GFR
BUN: 19 mg/dL (ref 7–25)
CO2: 28 mmol/L (ref 20–32)
Calcium: 9.7 mg/dL (ref 8.6–10.4)
Chloride: 105 mmol/L (ref 98–110)
Creat: 0.88 mg/dL (ref 0.50–0.99)
GFR, Est African American: 80 mL/min/{1.73_m2} (ref >=60)
GFR, Est Non African American: 69 mL/min/{1.73_m2} (ref >=60)
Glucose, Bld: 115 mg/dL — ABNORMAL HIGH (ref 65–99)
Potassium: 4 mmol/L (ref 3.5–5.3)
Sodium: 143 mmol/L (ref 135–146)

## 2020-07-18 NOTE — Progress Notes (Signed)
All labs are normal. 

## 2020-08-13 ENCOUNTER — Ambulatory Visit (INDEPENDENT_AMBULATORY_CARE_PROVIDER_SITE_OTHER): Payer: Medicare Other

## 2020-08-13 ENCOUNTER — Other Ambulatory Visit: Payer: Self-pay

## 2020-08-13 DIAGNOSIS — Z1231 Encounter for screening mammogram for malignant neoplasm of breast: Secondary | ICD-10-CM

## 2020-08-13 DIAGNOSIS — Z13820 Encounter for screening for osteoporosis: Secondary | ICD-10-CM

## 2020-08-13 DIAGNOSIS — Z780 Asymptomatic menopausal state: Secondary | ICD-10-CM | POA: Diagnosis not present

## 2020-11-11 ENCOUNTER — Ambulatory Visit (INDEPENDENT_AMBULATORY_CARE_PROVIDER_SITE_OTHER): Payer: Medicare Other | Admitting: Family Medicine

## 2020-11-11 ENCOUNTER — Other Ambulatory Visit: Payer: Self-pay

## 2020-11-11 ENCOUNTER — Encounter: Payer: Self-pay | Admitting: Family Medicine

## 2020-11-11 VITALS — BP 130/58 | HR 76 | Ht 63.0 in | Wt 210.0 lb

## 2020-11-11 DIAGNOSIS — Z0000 Encounter for general adult medical examination without abnormal findings: Secondary | ICD-10-CM | POA: Diagnosis not present

## 2020-11-11 DIAGNOSIS — Z23 Encounter for immunization: Secondary | ICD-10-CM

## 2020-11-11 NOTE — Patient Instructions (Addendum)
Ok to push out regular appt to end of October.    Keep up the great work with exercise.      Emily Mcdonald , Thank you for taking time to come for your Medicare Wellness Visit. I appreciate your ongoing commitment to your health goals. Please review the following plan we discussed and let me know if I can assist you in the future.   These are the goals we discussed:  Goals      Exercise 3x per week (30 min per time)        This is a list of the screening recommended for you and due dates:  Health Maintenance  Topic Date Due   HIV Screening  Never done   COVID-19 Vaccine (3 - Pfizer risk series) 01/07/2021*   Pneumonia vaccines (1 of 2 - PCV13) 07/17/2021*   Flu Shot  12/08/2020   Mammogram  08/14/2022   Tetanus Vaccine  09/29/2024   Colon Cancer Screening  12/03/2026   DEXA scan (bone density measurement)  Completed   Hepatitis C Screening: USPSTF Recommendation to screen - Ages 70-79 yo.  Completed   Zoster (Shingles) Vaccine  Completed   HPV Vaccine  Aged Out  *Topic was postponed. The date shown is not the original due date.

## 2020-11-11 NOTE — Progress Notes (Signed)
Subjective:    Emily Mcdonald is a 65 y.o. female who presents for a Welcome to Medicare exam.   Review of Systems ROS is neg        Objective:    Today's Vitals   11/11/20 0935  BP: (!) 130/58  Pulse: 76  SpO2: 98%  Weight: 210 lb (95.3 kg)  Height: 5\' 3"  (1.6 m)  Body mass index is 37.2 kg/m.  Medications Outpatient Encounter Medications as of 11/11/2020  Medication Sig   Cholecalciferol (D3-1000 PO) Take by mouth daily.    Cyanocobalamin (VITAMIN B-12 CR PO) Take 1 tablet by mouth daily.   losartan (COZAAR) 100 MG tablet Take 1 tablet (100 mg total) by mouth daily.   Multiple Vitamin (MULTIVITAMIN) tablet Take 1 tablet by mouth daily.   vitamin C (ASCORBIC ACID) 500 MG tablet Take 500 mg by mouth daily.   [DISCONTINUED] fish oil-omega-3 fatty acids 1000 MG capsule Take 2 g by mouth daily.   No facility-administered encounter medications on file as of 11/11/2020.     History: Past Medical History:  Diagnosis Date   Arthritis 03-31-11   arthritis fingers, and toes more right sided   Cancer ([place]) 03-31-11   dx. endometrial cancer , surgery planned   Hypertension 03-31-11   tx. med   Obesity    Past Surgical History:  Procedure Laterality Date   ABDOMINAL HYSTERECTOMY  04/06/11   Robotic BSO, LND   TOTAL ABDOMINAL HYSTERECTOMY W/ BILATERAL SALPINGOOPHORECTOMY      Family History  Problem Relation Age of Onset   Breast cancer Maternal Grandmother    Lymphoma Paternal Grandmother 74   Heart attack Mother 36   Diabetes Mother        late 4s.    Pulmonary fibrosis Mother        passed away from this   Diabetes Father    Diabetes Brother    Hypertension Brother    Diabetes Brother    Hypertension Brother    Lymphoma Brother    Pulmonary fibrosis Maternal Grandfather    Social History   Occupational History   Occupation: Pharmacist, hospital    Comment: [place]  Tobacco Use   Smoking status: Never   Smokeless tobacco: Never  Substance and  Sexual Activity   Alcohol use: Not on file   Drug use: Not on file   Sexual activity: Never    Tobacco Counseling Counseling given: Not Answered   Immunizations and Health Maintenance Immunization History  Administered Date(s) Administered   Hepatitis B 01/12/2019, 02/12/2019   Hepatitis B, adult 07/23/2019   Influenza Split 02/14/2012, 02/21/2018   Influenza Whole 03/17/2006, 12/24/2010   Influenza, Quadrivalent, Recombinant, Inj, Pf 02/21/2018   Influenza,inj,Quad PF,6+ Mos 01/04/2014, 03/21/2015, 01/05/2016, 02/01/2017, 01/17/2020   Influenza-Unspecified 02/12/2019   PFIZER(Purple Top)SARS-COV-2 Vaccination 08/09/2019, 08/30/2019   PNEUMOCOCCAL CONJUGATE-20 11/11/2020   Td 05/10/2005   Tdap 09/30/2014   Zoster Recombinat (Shingrix) 06/01/2018, 07/31/2018   Zoster, Live 01/05/2016   Health Maintenance Due  Topic Date Due   HIV Screening  Never done   PNA vac Low Risk Adult (1 of 2 - PCV13) 02/22/2020    Activities of Daily Living In your present state of health, do you have any difficulty performing the following activities: 11/11/2020  Hearing? N  Vision? N  Difficulty concentrating or making decisions? N  Walking or climbing stairs? Y  Dressing or bathing? N  Doing errands, shopping? Y  Preparing Food and eating ? N  Using the Toilet? N  In the past six months, have you accidently leaked urine? Y  Do you have problems with loss of bowel control? N  Managing your Medications? N  Managing your Finances? N  Housekeeping or managing your Housekeeping? N  Some recent data might be hidden    Physical Exam  (optional), or other factors deemed appropriate based on the beneficiary's medical and social history and current clinical standards.  Advanced Directives:      Assessment:    This is a routine wellness examination for this patient .   Vision/Hearing screen No results found.  Dietary issues and exercise activities discussed:  Current Exercise Habits: Home  exercise routine, Type of exercise: walking, Time (Minutes): 15, Frequency (Times/Week): 3, Weekly Exercise (Minutes/Week): 45, Intensity: Moderate, Exercise limited by: orthopedic condition(s)   Goals      Exercise 3x per week (30 min per time)       Depression Screen PHQ 2/9 Scores 11/11/2020 07/17/2020 12/07/2018 06/01/2018  PHQ - 2 Score 0 0 0 0     Fall Risk Fall Risk  11/11/2020  Falls in the past year? 0  Number falls in past yr: 0  Injury with Fall? 0  Risk for fall due to : No Fall Risks  Follow up Falls prevention discussed;Falls evaluation completed    Cognitive Function:     6CIT Screen 11/11/2020  What Year? 0 points  What month? 0 points  What time? 0 points  Count back from 20 0 points  Months in reverse 0 points  Repeat phrase 0 points  Total Score 0    Patient Care Team: [name], MD as PCP - General Marti Sleigh, MD as Attending Physician (Gynecology)     Plan:   Medicare Wellness   I have personally reviewed and noted the following in the patient's chart:   Medical and social history Use of alcohol, tobacco or illicit drugs  Current medications and supplements Functional ability and status Nutritional status Physical activity Advanced directives List of other physicians Hospitalizations, surgeries, and ER visits in previous 12 months Vitals Screenings to include cognitive, depression, and falls Referrals and appointments EKG today shows rate of 80 bpm, normal sinus rhythm with no acute ST-T wave changes.  In addition, I have reviewed and discussed with patient certain preventive protocols, quality metrics, and best practice recommendations. A written personalized care plan for preventive services as well as general preventive health recommendations were provided to patient.     Emily Lecher, MD 11/11/2020

## 2020-11-12 ENCOUNTER — Ambulatory Visit (INDEPENDENT_AMBULATORY_CARE_PROVIDER_SITE_OTHER): Payer: Medicare Other | Admitting: Sports Medicine

## 2020-11-12 ENCOUNTER — Ambulatory Visit (INDEPENDENT_AMBULATORY_CARE_PROVIDER_SITE_OTHER): Payer: Medicare Other

## 2020-11-12 ENCOUNTER — Other Ambulatory Visit: Payer: Self-pay

## 2020-11-12 DIAGNOSIS — M1711 Unilateral primary osteoarthritis, right knee: Secondary | ICD-10-CM

## 2020-11-12 DIAGNOSIS — Z09 Encounter for follow-up examination after completed treatment for conditions other than malignant neoplasm: Secondary | ICD-10-CM | POA: Diagnosis not present

## 2020-11-12 MED ORDER — MELOXICAM 15 MG PO TABS: ORAL_TABLET | ORAL | 3 refills | Status: DC

## 2020-11-12 NOTE — Progress Notes (Signed)
    Procedures performed today:    None.  Independent interpretation of notes and tests performed by another provider:   None.  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 Exam there is some tenderness at the lateral joint line, pain with terminal flexion, crepitus. She likely has some osteoarthritis. She also has pes planus on the right, we are can add some scaphoid pads into her sandals, she has custom molded orthotics already, adding meloxicam, x-rays, formal physical therapy, return to see me in 4 to 6 weeks, injection if no better.    ___________________________________________ Gwen Her. Dianah Field, M.D., ABFM., CAQSM. Primary Care and [place] Instructor of [place] [facility] [employer]

## 2020-11-12 NOTE — Assessment & Plan Note (Signed)
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 Exam there is some tenderness at the lateral joint line, pain with terminal flexion, crepitus. She likely has some osteoarthritis. She also has pes planus on the right, we are can add some scaphoid pads into her sandals, she has custom molded orthotics already, adding meloxicam, x-rays, formal physical therapy, return to see me in 4 to 6 weeks, injection if no better.

## 2020-11-25 ENCOUNTER — Other Ambulatory Visit: Payer: Self-pay

## 2020-11-25 ENCOUNTER — Encounter: Payer: Self-pay | Admitting: Physical Therapy

## 2020-11-25 ENCOUNTER — Ambulatory Visit (INDEPENDENT_AMBULATORY_CARE_PROVIDER_SITE_OTHER): Payer: Medicare Other | Admitting: Physical Therapy

## 2020-11-25 DIAGNOSIS — M25561 Pain in right knee: Secondary | ICD-10-CM | POA: Diagnosis not present

## 2020-11-25 DIAGNOSIS — G8929 Other chronic pain: Secondary | ICD-10-CM | POA: Diagnosis not present

## 2020-11-25 DIAGNOSIS — M6281 Muscle weakness (generalized): Secondary | ICD-10-CM

## 2020-11-25 DIAGNOSIS — R29898 Other symptoms and signs involving the musculoskeletal system: Secondary | ICD-10-CM

## 2020-11-25 NOTE — Therapy (Signed)
[place], [place], [address] Phone: [phone]   Fax:  [phone]  Physical Therapy Evaluation  Patient Details  Name: Emily Mcdonald MRN: [id number] Date of Birth: 10-Jul-1955 Referring Provider (PT): thekkekandam   Encounter Date: 11/25/2020   PT End of Session - 11/25/20 0912     Visit Number 1    Number of Visits 12    Date for PT Re-Evaluation 01/06/21    PT Start Time 0838    PT Stop Time 0913    PT Time Calculation (min) 35 min    Activity Tolerance Patient tolerated treatment well    Behavior During Therapy [facility] for tasks assessed/performed             Past Medical History:  Diagnosis Date   Arthritis 03-31-11   arthritis fingers, and toes more right sided   Cancer ([place]) 03-31-11   dx. endometrial cancer , surgery planned   Hypertension 03-31-11   tx. med   Obesity     Past Surgical History:  Procedure Laterality Date   ABDOMINAL HYSTERECTOMY  04/06/11   Robotic BSO, LND   TOTAL ABDOMINAL HYSTERECTOMY W/ BILATERAL SALPINGOOPHORECTOMY      There were no vitals filed for this visit.    Subjective Assessment - 11/25/20 0837     Subjective Pt had a Rt knee injury decades ago with no medical intervention. Then 4 years ago pt had a Rt ankle stress fracture that has slowly increased Rt knee pain. X rays show arthritis in Rt knee and MD recommended physical therapy and pain meds. Pain increases with bending, prolonged standing and descending stairs. Eased with heating pad and rest.    Limitations Walking;Standing;House hold activities    How long can you walk comfortably? 20-30 minutes    Diagnostic tests x ray shows arthritis    Patient Stated Goals decrease pain    Currently in Pain? Yes    Pain Score 3     Pain Location Knee    Pain Orientation Right    Pain Descriptors / Indicators Sore    Pain Type Chronic pain    Pain Onset More than a month ago    Pain Frequency  Intermittent    Aggravating Factors  squat, bend    Pain Relieving Factors heat, meds                [facility] PT Assessment - 11/25/20 0001       Assessment   Medical Diagnosis primary OA of right knee    Referring Provider (PT) thekkekandam      Balance Screen   Has the patient fallen in the past 6 months No      Prior Function   Level of Independence Independent      Observation/Other Assessments   Focus on Therapeutic Outcomes (FOTO)  67 functional status measure      Functional Tests   Functional tests Step down;Single leg stance      Step Down   Comments pt with medial knee instability noted on Rt with step down      Single Leg Stance   Comments Lt > 15 sec, Rt 6 sec      ROM / Strength   AROM / PROM / Strength AROM;Strength      AROM   AROM Assessment Site Knee    Right/Left Knee Right;Left    Right Knee Extension 0    Right Knee Flexion 120  Left Knee Extension 0    Left Knee Flexion 120      Strength   Overall Strength Comments Lt hip flexion 4/5, Rt hip flexion 3/5    Strength Assessment Site Knee    Right/Left Knee Right;Left    Right Knee Flexion 4/5    Right Knee Extension 4+/5    Left Knee Flexion 4/5    Left Knee Extension 4+/5      Palpation   Patella mobility crepitus with Lt patella mobs all directions    Palpation comment TTP distal Rt knee                        Objective measurements completed on examination: See above findings.       [place] [facility] [place] Adult PT Treatment/Exercise - 11/25/20 0001       Exercises   Exercises Knee/Hip      Knee/Hip Exercises: Standing   SLS up to 15 sec bilat      Knee/Hip Exercises: Supine   Bridges 10 reps    Straight Leg Raises 10 reps    Straight Leg Raise with External Rotation 10 reps      Knee/Hip Exercises: Sidelying   Hip ABduction 10 reps                    PT Education - 11/25/20 0912     Education Details PT POC and goals, HEP    Person(s) Educated Patient     Methods Explanation;Demonstration;Handout    Comprehension Returned demonstration;Verbalized understanding                 PT Long Term Goals - 11/25/20 0916       PT LONG TERM GOAL #1   Title Pt will be independent in HEP    Time 6    Period Weeks    Status New    Target Date 01/06/21      PT LONG TERM GOAL #2   Title Pt will improve FOTO to >= 70 to demo improved functional mobility    Time 6    Period Weeks    Status New    Target Date 01/06/21      PT LONG TERM GOAL #3   Title Pt will tolerate standing for a full day at work with Rt knee pain <= 2/10    Time 6    Period Weeks    Status New    Target Date 01/06/21      PT LONG TERM GOAL #4   Title Pt will improve Rt SLS to >= 15 seconds    Time 6    Period Weeks    Status New    Target Date 01/06/21                    Plan - 11/25/20 0913     Clinical Impression Statement Pt is a 65 y/o female referred for Rt knee pain. Pt presents with decreased Rt LE strength, impaired balance and decreased functional activity tolerance. Pt will benefit from skilled PT to address deficits and improve functional mobility    Personal Factors and Comorbidities Age;Comorbidity 2;Time since onset of injury/illness/exacerbation    Examination-Activity Limitations Stand;Stairs;Squat;Locomotion Level    Examination-Participation Restrictions Community Activity;Yard Work;Occupation    Stability/Clinical Decision Making Stable/Uncomplicated    Clinical Decision Making Low    Rehab Potential Good    PT Frequency 2x / week    PT Duration  6 weeks    PT Treatment/Interventions Aquatic Therapy;Electrical Stimulation;Iontophoresis 4mg /ml Dexamethasone;Cryotherapy;Moist Heat;Stair training;Gait training;Therapeutic activities;Therapeutic exercise;Patient/family education;Balance training;Neuromuscular re-education;Taping;Manual techniques;Dry needling;Vasopneumatic Device    PT Next Visit Plan asses HEP, progress wt bearing  strengthening    PT Home Exercise Plan [email or web address]    Consulted and Agree with Plan of Care Patient             Patient will benefit from skilled therapeutic intervention in order to improve the following deficits and impairments:  Pain, Decreased strength, Decreased activity tolerance, Difficulty walking, Decreased balance  Visit Diagnosis: Chronic pain of right knee - Plan: PT plan of care cert/re-cert  Muscle weakness (generalized) - Plan: PT plan of care cert/re-cert  Other symptoms and signs involving the musculoskeletal system - Plan: PT plan of care cert/re-cert     Problem List Patient Active Problem List   Diagnosis Date Noted   Foot pain, right 11/11/2016   Primary osteoarthritis of right knee 10/27/2016   Obesity (BMI 30-39.9) 01/04/2014   Endometrial cancer ([place]) 03/19/2011   RESTLESS LEG SYNDROME 11/28/2007   HYPERTENSION, BENIGN ESSENTIAL 03/17/2006   Kimyetta Flott, PT  Jacoya Bauman 11/25/2020, 9:20 AM  [facility] [place], [place], [address] Phone: [phone]   Fax:  [phone]  Name: STEFFANI DIONISIO MRN: [id number] Date of Birth: [birth year]

## 2020-11-25 NOTE — Patient Instructions (Signed)
Access Code: [email or web address] URL: [email or web address] Date: 11/25/2020 Prepared by: Isabelle Course  Exercises Small Range Straight Leg Raise - 1 x daily - 7 x weekly - 3 sets - 10 reps Straight Leg Raise with External Rotation - 1 x daily - 7 x weekly - 3 sets - 10 reps Supine Bridge - 1 x daily - 7 x weekly - 3 sets - 10 reps Sidelying Hip Abduction - 1 x daily - 7 x weekly - 3 sets - 10 reps Standing Single Leg Stance with Counter Support - 1 x daily - 7 x weekly - 3 sets - 1 reps - 15-20 seconds hold

## 2020-11-27 ENCOUNTER — Encounter: Payer: Self-pay | Admitting: Physical Therapy

## 2020-11-27 ENCOUNTER — Ambulatory Visit (INDEPENDENT_AMBULATORY_CARE_PROVIDER_SITE_OTHER): Payer: Medicare Other | Admitting: Physical Therapy

## 2020-11-27 ENCOUNTER — Other Ambulatory Visit: Payer: Self-pay

## 2020-11-27 DIAGNOSIS — G8929 Other chronic pain: Secondary | ICD-10-CM

## 2020-11-27 DIAGNOSIS — R29898 Other symptoms and signs involving the musculoskeletal system: Secondary | ICD-10-CM | POA: Diagnosis not present

## 2020-11-27 DIAGNOSIS — M25561 Pain in right knee: Secondary | ICD-10-CM

## 2020-11-27 DIAGNOSIS — M6281 Muscle weakness (generalized): Secondary | ICD-10-CM | POA: Diagnosis not present

## 2020-11-27 NOTE — Patient Instructions (Addendum)
  Access Code: [email or web address] URL: [email or web address] Date: 11/27/2020 Prepared by: [facility]  Exercises Small Range Straight Leg Raise - 1 x daily - 7 x weekly - 2 sets - 10 reps Straight Leg Raise with External Rotation - 1 x daily - 7 x weekly - 2 sets - 10 reps Sidelying Hip Abduction - 1 x daily - 7 x weekly - 2 sets - 10 reps Tandem Stance - 1 x daily - 7 x weekly - 1 sets - 2 reps - 15-20 hold Standing Single Leg Stance with Counter Support - 1 x daily - 7 x weekly - 2 sets - 1 reps - 15-20 seconds hold Figure 4 Bridge - 1 x daily - 7 x weekly - 2 sets - 10 reps Supine Piriformis Stretch with Foot on Ground - 1-2 x daily - 7 x weekly - 1 sets - 2-3 reps - 20 sec hold Seated Table Piriformis Stretch - 1-2 x daily - 7 x weekly - 1 sets - 2-3 reps - 20 sec hold Prone Quadriceps Stretch with Strap - 1-2 x daily - 7 x weekly - 1 sets - 3 reps - 20 sec hold

## 2020-11-27 NOTE — Therapy (Signed)
[place], [place], [address] Phone: [phone]   Fax:  [phone]  Physical Therapy Treatment  Patient Details  Name: Emily Mcdonald MRN: [id number] Date of Birth: 06-27-1955 Referring Provider (PT): Thekkekandam   Encounter Date: 11/27/2020   PT End of Session - 11/27/20 0933     Visit Number 2    Number of Visits 12    Date for PT Re-Evaluation 01/06/21    PT Start Time 0932    PT Stop Time 1017    PT Time Calculation (min) 45 min    Activity Tolerance Patient tolerated treatment well    Behavior During Therapy [facility] for tasks assessed/performed             Past Medical History:  Diagnosis Date   Arthritis 03-31-11   arthritis fingers, and toes more right sided   Cancer ([place]) 03-31-11   dx. endometrial cancer , surgery planned   Hypertension 03-31-11   tx. med   Obesity     Past Surgical History:  Procedure Laterality Date   ABDOMINAL HYSTERECTOMY  04/06/11   Robotic BSO, LND   TOTAL ABDOMINAL HYSTERECTOMY W/ BILATERAL SALPINGOOPHORECTOMY      There were no vitals filed for this visit.   Subjective Assessment - 11/27/20 0936     Subjective Pt reports that the anti-inflammatory meds dr gave are "helping tremdenously".  The only issue she found with HEP is that the Lt side leg lifts caused tightness in her hip.    Limitations Walking;Standing;House hold activities    How long can you walk comfortably? 20-30 minutes    Diagnostic tests x ray shows arthritis    Patient Stated Goals decrease pain    Currently in Pain? No/denies    Pain Score 0-No pain    Pain Onset More than a month ago                [facility] PT Assessment - 11/27/20 0001       Assessment   Medical Diagnosis primary OA of right knee    Referring Provider (PT) Dianah Field    Next MD Visit 12/24/20               [facility] Adult PT Treatment/Exercise - 11/27/20 0001       Knee/Hip Exercises:  Stretches   Quad Stretch Right;Left;2 reps;20 seconds   prone with strap ; unable to complete in standing   Hip Flexor Stretch Right;1 rep;20 seconds   seated with leg off side of chair.   Piriformis Stretch Right;Left;2 reps;20 seconds    Piriformis Stretch Limitations also 1 rep of modified pigeon pose    Gastroc Stretch Right;Left;1 rep;20 seconds   runners stretch     Knee/Hip Exercises: Aerobic   Nustep L5: 4.5 min, legs only for warm up      Knee/Hip Exercises: Supine   Bridges Limitations (too easy, moved to single leg)    Single Leg Bridge Strengthening;Right;Left;1 set;10 reps   fig 4 with legs   Straight Leg Raises Strengthening;Right;Left;2 sets;10 reps    Straight Leg Raise with External Rotation Strengthening;Right;Left;1 set;10 reps      Knee/Hip Exercises: Sidelying   Hip ABduction Right;Left;1 set;10 reps                    PT Education - 11/27/20 1021     Education Details HEP updated.    Person(s) Educated Patient    Methods Explanation;Handout;Verbal cues  Comprehension Verbalized understanding;Returned demonstration                 PT Long Term Goals - 11/27/20 9826       PT LONG TERM GOAL #1   Title Pt will be independent in HEP    Time 6    Period Weeks    Status On-going      PT LONG TERM GOAL #2   Title Pt will improve FOTO to >= 70 to demo improved functional mobility    Time 6    Period Weeks    Status On-going      PT LONG TERM GOAL #3   Title Pt will tolerate standing for a full day at work with Rt knee pain <= 2/10    Time 6    Period Weeks    Status On-going      PT LONG TERM GOAL #4   Title Pt will improve Rt SLS to >= 15 seconds    Time 6    Period Weeks    Status On-going                   Plan - 11/27/20 1022     Clinical Impression Statement Pt tolerated exercises well, reporting increased tightness in Lt hip abdct/ER musculature and Rt quad. She remained pain free throughout session.   Decreased single leg balance on RLE. Goals are ongoing.    Personal Factors and Comorbidities Age;Comorbidity 2;Time since onset of injury/illness/exacerbation    Examination-Activity Limitations Stand;Stairs;Squat;Locomotion Level    Examination-Participation Restrictions Community Activity;Yard Work;Occupation    Stability/Clinical Decision Making Stable/Uncomplicated    Rehab Potential Good    PT Frequency 2x / week    PT Duration 6 weeks    PT Treatment/Interventions Aquatic Therapy;Electrical Stimulation;Iontophoresis 4mg /ml Dexamethasone;Cryotherapy;Moist Heat;Stair training;Gait training;Therapeutic activities;Therapeutic exercise;Patient/family education;Balance training;Neuromuscular re-education;Taping;Manual techniques;Dry needling;Vasopneumatic Device    PT Next Visit Plan progress wt bearing strengthening    PT Home Exercise Plan [email or web address]    Consulted and Agree with Plan of Care Patient             Patient will benefit from skilled therapeutic intervention in order to improve the following deficits and impairments:  Pain, Decreased strength, Decreased activity tolerance, Difficulty walking, Decreased balance  Visit Diagnosis: Chronic pain of right knee  Muscle weakness (generalized)  Other symptoms and signs involving the musculoskeletal system     Problem List Patient Active Problem List   Diagnosis Date Noted   Foot pain, right 11/11/2016   Primary osteoarthritis of right knee 10/27/2016   Obesity (BMI 30-39.9) 01/04/2014   Endometrial cancer ([place]) 03/19/2011   RESTLESS LEG SYNDROME 11/28/2007   HYPERTENSION, BENIGN ESSENTIAL 03/17/2006   Kerin Perna, PTA 11/27/20 10:26 AM  [place], [place], [address] Phone: [phone]   Fax:  [phone]  Name: Emily Mcdonald MRN: [id number] Date of Birth: March 24, 1956

## 2020-12-01 ENCOUNTER — Encounter: Payer: Self-pay | Admitting: Physical Therapy

## 2020-12-01 ENCOUNTER — Other Ambulatory Visit: Payer: Self-pay

## 2020-12-01 ENCOUNTER — Ambulatory Visit (INDEPENDENT_AMBULATORY_CARE_PROVIDER_SITE_OTHER): Payer: Medicare Other | Admitting: Physical Therapy

## 2020-12-01 DIAGNOSIS — M6281 Muscle weakness (generalized): Secondary | ICD-10-CM | POA: Diagnosis not present

## 2020-12-01 DIAGNOSIS — G8929 Other chronic pain: Secondary | ICD-10-CM | POA: Diagnosis not present

## 2020-12-01 DIAGNOSIS — R29898 Other symptoms and signs involving the musculoskeletal system: Secondary | ICD-10-CM | POA: Diagnosis not present

## 2020-12-01 DIAGNOSIS — M25561 Pain in right knee: Secondary | ICD-10-CM

## 2020-12-01 NOTE — Therapy (Signed)
[place], [place], [address] Phone: [phone]   Fax:  [phone]  Physical Therapy Treatment  Patient Details  Name: Emily Mcdonald MRN: [id number] Date of Birth: 1956/03/13 Referring Provider (PT): Thekkekandam   Encounter Date: 12/01/2020   PT End of Session - 12/01/20 0930     Visit Number 3    Number of Visits 12    Date for PT Re-Evaluation 01/06/21    PT Start Time 0930    PT Stop Time [id number]    PT Time Calculation (min) 42 min    Activity Tolerance Patient tolerated treatment well    Behavior During Therapy [facility] for tasks assessed/performed             Past Medical History:  Diagnosis Date   Arthritis 03-31-11   arthritis fingers, and toes more right sided   Cancer ([place]) 03-31-11   dx. endometrial cancer , surgery planned   Hypertension 03-31-11   tx. med   Obesity     Past Surgical History:  Procedure Laterality Date   ABDOMINAL HYSTERECTOMY  04/06/11   Robotic BSO, LND   TOTAL ABDOMINAL HYSTERECTOMY W/ BILATERAL SALPINGOOPHORECTOMY      There were no vitals filed for this visit.   Subjective Assessment - 12/01/20 0935     Subjective Pt reports she was unable to find a place to perform the piriformis stretch.  She went without meloxicam yesterday and did fine, but took it this morning since she was coming to therapy.  She is preparing to return to school (8/10).    Limitations Walking;Standing;House hold activities    How long can you walk comfortably? 20-30 minutes    Diagnostic tests x ray shows arthritis    Patient Stated Goals decrease pain    Pain Onset More than a month ago                [facility] PT Assessment - 12/01/20 0001       Assessment   Medical Diagnosis primary OA of right knee    Referring Provider (PT) Dianah Field    Next MD Visit 12/24/20              [facility] Adult PT Treatment/Exercise - 12/01/20 0001       Self-Care   Self-Care  Other Self-Care Comments    Other Self-Care Comments  pt instructed in self massage with roller stick to Rt lateral distal quad to decrease fascial restrictions.      Knee/Hip Exercises: Stretches   Pension scheme manager reps;30 seconds   prone with strap   Hip Flexor Stretch Right;2 reps;Left;1 rep;20 seconds    Piriformis Stretch Right;2 reps;Left;1 rep;30 seconds   seated     Knee/Hip Exercises: Aerobic   Nustep L5: 5.5 min, legs only for warm up      Knee/Hip Exercises: Standing   Lateral Step Up Right;1 set;10 reps;Hand Hold: 1;Step Height: 6"    Step Down Left;1 set;10 reps;Step Height: 4"   and retro step up with RLE>   SLS SLS with toe taps front, side, back x 5 reps, 2 sets on RLE, 1 set on LLE.      Manual Therapy   Manual Therapy Soft tissue mobilization;Taping    Soft tissue mobilization IASTM to distal Rt lateral quad  to decrease fascial restrictions and improve mobility.    Anderson I strip of reg  Rock tape applied to Rt lateral distal quad to prox tib (over lateral fat pad). perpendicular strip applied to fat pad/joint line to decompress tissue and increase proprioception.                    PT Education - 12/01/20 1014     Education Details HEP- added lateral step up (verbally), info on ktape    Person(s) Educated Patient    Methods Explanation;Handout    Comprehension Verbalized understanding                 PT Long Term Goals - 11/27/20 0938       PT LONG TERM GOAL #1   Title Pt will be independent in HEP    Time 6    Period Weeks    Status On-going      PT LONG TERM GOAL #2   Title Pt will improve FOTO to >= 70 to demo improved functional mobility    Time 6    Period Weeks    Status On-going      PT LONG TERM GOAL #3   Title Pt will tolerate standing for a full day at work with Rt knee pain <= 2/10    Time 6    Period Weeks    Status On-going      PT LONG TERM GOAL #4   Title Pt  will improve Rt SLS to >= 15 seconds    Time 6    Period Weeks    Status On-going                   Plan - 12/01/20 1018     Clinical Impression Statement Pt tolerated exercises well, reporting increased tightness in Rt lateral distal thigh with all exericses.  Pt instructed in self massage to area and trial of tape applied.  She reported reduction of tightness / discomfort in Rt knee and hp at end of session. Progressing towards goals.    Personal Factors and Comorbidities Age;Comorbidity 2;Time since onset of injury/illness/exacerbation    Examination-Activity Limitations Stand;Stairs;Squat;Locomotion Level    Examination-Participation Restrictions Community Activity;Yard Work;Occupation    Stability/Clinical Decision Making Stable/Uncomplicated    Rehab Potential Good    PT Frequency 2x / week    PT Duration 6 weeks    PT Treatment/Interventions Aquatic Therapy;Electrical Stimulation;Iontophoresis '4mg'$ /ml Dexamethasone;Cryotherapy;Moist Heat;Stair training;Gait training;Therapeutic activities;Therapeutic exercise;Patient/family education;Balance training;Neuromuscular re-education;Taping;Manual techniques;Dry needling;Vasopneumatic Device    PT Next Visit Plan progress wt bearing strengthening/balance for RLE.  assess response to tape.    PT Home Exercise Plan [email or web address]    Consulted and Agree with Plan of Care Patient             Patient will benefit from skilled therapeutic intervention in order to improve the following deficits and impairments:  Pain, Decreased strength, Decreased activity tolerance, Difficulty walking, Decreased balance  Visit Diagnosis: Chronic pain of right knee  Muscle weakness (generalized)  Other symptoms and signs involving the musculoskeletal system     Problem List Patient Active Problem List   Diagnosis Date Noted   Foot pain, right 11/11/2016   Primary osteoarthritis of right knee 10/27/2016   Obesity (BMI 30-39.9) 01/04/2014    Endometrial cancer ([place]) 03/19/2011   RESTLESS LEG SYNDROME 11/28/2007   HYPERTENSION, BENIGN ESSENTIAL 03/17/2006   Emily Mcdonald, PTA 12/01/20 10:20 AM  [facility] [place], [place], [address] Phone: [phone]   Fax:  [phone]  Name: Emily DUNSFORD MRN: [id number] Date of Birth: 04-29-1956

## 2020-12-01 NOTE — Patient Instructions (Signed)

## 2020-12-03 ENCOUNTER — Other Ambulatory Visit: Payer: Self-pay

## 2020-12-03 ENCOUNTER — Ambulatory Visit (INDEPENDENT_AMBULATORY_CARE_PROVIDER_SITE_OTHER): Payer: Medicare Other | Admitting: Physical Therapy

## 2020-12-03 DIAGNOSIS — M25561 Pain in right knee: Secondary | ICD-10-CM | POA: Diagnosis not present

## 2020-12-03 DIAGNOSIS — G8929 Other chronic pain: Secondary | ICD-10-CM

## 2020-12-03 DIAGNOSIS — M6281 Muscle weakness (generalized): Secondary | ICD-10-CM | POA: Diagnosis not present

## 2020-12-03 DIAGNOSIS — R29898 Other symptoms and signs involving the musculoskeletal system: Secondary | ICD-10-CM

## 2020-12-03 NOTE — Therapy (Signed)
[place], [place], [address] Phone: [phone]   Fax:  [phone]  Physical Therapy Treatment  Patient Details  Name: Emily Mcdonald MRN: [id number] Date of Birth: 1955-05-27 Referring Provider (PT): Thekkekandam   Encounter Date: 12/03/2020   PT End of Session - 12/03/20 1023     Visit Number 4    Number of Visits 12    Date for PT Re-Evaluation 01/06/21    PT Start Time 0930    PT Stop Time 1013    PT Time Calculation (min) 43 min    Activity Tolerance Patient tolerated treatment well    Behavior During Therapy [facility] for tasks assessed/performed             Past Medical History:  Diagnosis Date   Arthritis 03-31-11   arthritis fingers, and toes more right sided   Cancer ([place]) 03-31-11   dx. endometrial cancer , surgery planned   Hypertension 03-31-11   tx. med   Obesity     Past Surgical History:  Procedure Laterality Date   ABDOMINAL HYSTERECTOMY  04/06/11   Robotic BSO, LND   TOTAL ABDOMINAL HYSTERECTOMY W/ BILATERAL SALPINGOOPHORECTOMY      There were no vitals filed for this visit.   Subjective Assessment - 12/03/20 0936     Subjective Pt states that this morning her Rt achilles begain hurting, insiduous onset. Pt states she feels her knee is a lot better    Patient Stated Goals decrease pain    Currently in Pain? Yes    Pain Score 3     Pain Location Ankle    Pain Orientation Right    Pain Descriptors / Indicators Other (Comment)   pulling   Pain Type Acute pain                [facility] PT Assessment - 12/03/20 0001       Assessment   Medical Diagnosis primary OA of right knee    Referring Provider (PT) Dianah Field    Next MD Visit 12/24/20      Single Leg Stance   Comments > 15 sec bilat                           [facility] Adult PT Treatment/Exercise - 12/03/20 0001       Knee/Hip Exercises: Stretches   Sports administrator Right;2 reps;30  seconds   prone with strap   Hip Flexor Stretch Right;2 reps;Left;1 rep;20 seconds    Piriformis Stretch Right;2 reps;Left;1 rep;30 seconds   seated     Knee/Hip Exercises: Aerobic   Nustep L5 x 5 mins legs only for warm up      Knee/Hip Exercises: Standing   Heel Raises 15 reps    Lateral Step Up Right;1 set;Hand Hold: 0;Step Height: 6"    Forward Step Up Right;1 set;10 reps;Hand Hold: 0;Step Height: 6"    Step Down Left;1 set;10 reps;Step Height: 4"   and retro step up with RLE   Wall Squat 10 reps    Wall Squat Limitations pain free range    Rocker Board 1 minute    Rocker Board Limitations A/P and laterally   increased difficulty with A/P   SLS SLS with toe taps front, side, back x 5 reps, 2 sets on RLE, 1 set on LLE.    Other Standing Knee Exercises tandem stance on foam 2 x 30 sec with intermittent UE support  Manual Therapy   Soft tissue mobilization STM distal quad to reduce mm spasticity      Kinesiotix   Create Space I strip of reg Rock tape applied to Rt lateral distal quad to prox tib (over lateral fat pad). perpendicular strip applied to fat pad/joint line to decompress tissue and increase proprioception.                         PT Long Term Goals - 11/27/20 [id number]       PT LONG TERM GOAL #1   Title Pt will be independent in HEP    Time 6    Period Weeks    Status On-going      PT LONG TERM GOAL #2   Title Pt will improve FOTO to >= 70 to demo improved functional mobility    Time 6    Period Weeks    Status On-going      PT LONG TERM GOAL #3   Title Pt will tolerate standing for a full day at work with Rt knee pain <= 2/10    Time 6    Period Weeks    Status On-going      PT LONG TERM GOAL #4   Title Pt will improve Rt SLS to >= 15 seconds    Time 6    Period Weeks    Status On-going                   Plan - 12/03/20 1030     Clinical Impression Statement Pt continues to tolerate progression of exercises well. Pt felt  good relief with tape and it was applied again. Pt continues with distal quad tightness and was open to the idea of dry needling at a later visit    PT Next Visit Plan DN if appropriate, progress standing therex and balance    PT Home Exercise Plan [id number]    Consulted and Agree with Plan of Care Patient             Patient will benefit from skilled therapeutic intervention in order to improve the following deficits and impairments:     Visit Diagnosis: Chronic pain of right knee  Muscle weakness (generalized)  Other symptoms and signs involving the musculoskeletal system     Problem List Patient Active Problem List   Diagnosis Date Noted   Foot pain, right 11/11/2016   Primary osteoarthritis of right knee 10/27/2016   Obesity (BMI 30-39.9) 01/04/2014   Endometrial cancer ([place]) 03/19/2011   RESTLESS LEG SYNDROME 11/28/2007   HYPERTENSION, BENIGN ESSENTIAL 03/17/2006   Danni Shima, PT  Laiya Wisby 12/03/2020, 10:37 AM  [facility] [place], [place], [address] Phone: [phone]   Fax:  [phone]  Name: MISCHELL LANGER MRN: [id number] Date of Birth: [birth year]

## 2020-12-09 ENCOUNTER — Other Ambulatory Visit: Payer: Self-pay

## 2020-12-09 ENCOUNTER — Ambulatory Visit (INDEPENDENT_AMBULATORY_CARE_PROVIDER_SITE_OTHER): Payer: Medicare Other | Admitting: Physical Therapy

## 2020-12-09 DIAGNOSIS — G8929 Other chronic pain: Secondary | ICD-10-CM | POA: Diagnosis not present

## 2020-12-09 DIAGNOSIS — M25561 Pain in right knee: Secondary | ICD-10-CM | POA: Diagnosis not present

## 2020-12-09 DIAGNOSIS — R29898 Other symptoms and signs involving the musculoskeletal system: Secondary | ICD-10-CM

## 2020-12-09 DIAGNOSIS — M6281 Muscle weakness (generalized): Secondary | ICD-10-CM | POA: Diagnosis not present

## 2020-12-09 NOTE — Therapy (Signed)
[place], [place], [address] Phone: [phone]   Fax:  [phone]  Physical Therapy Treatment  Patient Details  Name: Emily Mcdonald MRN: [id number] Date of Birth: [birth year] Referring Provider (PT): Thekkekandam   Encounter Date: 12/09/2020   PT End of Session - 12/09/20 1013     Visit Number 5    Number of Visits 12    Date for PT Re-Evaluation 01/06/21    PT Start Time 0930    PT Stop Time [id number]    PT Time Calculation (min) 42 min    Activity Tolerance Patient tolerated treatment well    Behavior During Therapy [facility] for tasks assessed/performed             Past Medical History:  Diagnosis Date   Arthritis 03-31-11   arthritis fingers, and toes more right sided   Cancer ([place]) 03-31-11   dx. endometrial cancer , surgery planned   Hypertension 03-31-11   tx. med   Obesity     Past Surgical History:  Procedure Laterality Date   ABDOMINAL HYSTERECTOMY  04/06/11   Robotic BSO, LND   TOTAL ABDOMINAL HYSTERECTOMY W/ BILATERAL SALPINGOOPHORECTOMY      There were no vitals filed for this visit.   Subjective Assessment - 12/09/20 0935     Subjective Pt states she is feeling better. She starts work again next week. states the HEP is getting easier    Currently in Pain? No/denies                               [facility] Adult PT Treatment/Exercise - 12/09/20 0001       Knee/Hip Exercises: Stretches   Quad Stretch Right;2 reps;30 seconds   prone with strap   Hip Flexor Stretch Right;2 reps;Left;1 rep;20 seconds      Knee/Hip Exercises: Aerobic   Nustep L5 x 5 mins legs only for warm up      Knee/Hip Exercises: Standing   Lateral Step Up Right;2 sets;10 reps;Hand Hold: 0;Left    Forward Step Up Right;Left;2 sets;10 reps    Step Down Left;1 set;10 reps;Step Height: 4";Right   and retro step up with RLE   Rocker Board 1 minute    Rocker Board Limitations A/P and  laterally    SLS SLS with toe taps x 10 bilat on foam    Other Standing Knee Exercises tandem stance on foam 2 x 30 sec with intermittent UE support      Manual Therapy   Soft tissue mobilization STM distal quad to reduce mm spasticity                    PT Education - 12/09/20 1012     Education Details updated HEP    Person(s) Educated Patient    Methods Handout;Demonstration;Explanation    Comprehension Verbalized understanding;Returned demonstration                 PT Long Term Goals - 11/27/20 0938       PT LONG TERM GOAL #1   Title Pt will be independent in HEP    Time 6    Period Weeks    Status On-going      PT LONG TERM GOAL #2   Title Pt will improve FOTO to >= 70 to demo improved functional mobility    Time 6    Period Weeks  Status On-going      PT LONG TERM GOAL #3   Title Pt will tolerate standing for a full day at work with Rt knee pain <= 2/10    Time 6    Period Weeks    Status On-going      PT LONG TERM GOAL #4   Title Pt will improve Rt SLS to >= 15 seconds    Time 6    Period Weeks    Status On-going                   Plan - 12/09/20 1013     Clinical Impression Statement Pt is progressing well and has increased strength and endurance and decreased pain. HEP updated to add step ups and wall squats    PT Next Visit Plan d/c, assess goals, FOTO    PT Home Exercise Plan [id number]    Consulted and Agree with Plan of Care Patient             Patient will benefit from skilled therapeutic intervention in order to improve the following deficits and impairments:     Visit Diagnosis: Chronic pain of right knee  Muscle weakness (generalized)  Other symptoms and signs involving the musculoskeletal system     Problem List Patient Active Problem List   Diagnosis Date Noted   Foot pain, right 11/11/2016   Primary osteoarthritis of right knee 10/27/2016   Obesity (BMI 30-39.9) 01/04/2014   Endometrial cancer  ([place]) 03/19/2011   RESTLESS LEG SYNDROME 11/28/2007   HYPERTENSION, BENIGN ESSENTIAL 03/17/2006   Gibson Telleria, PT  Kristell Wooding 12/09/2020, 10:14 AM  [facility] [place], [place], [address] Phone: [phone]   Fax:  [phone]  Name: Emily Mcdonald MRN: [id number] Date of Birth: 1955/12/18

## 2020-12-09 NOTE — Patient Instructions (Signed)
Access Code: [email or web address] URL: [email or web address] Date: 12/09/2020 Prepared by: Isabelle Course  Exercises Figure 4 Bridge - 1 x daily - 7 x weekly - 2 sets - 10 reps Supine Piriformis Stretch with Foot on Ground - 1-2 x daily - 7 x weekly - 1 sets - 2-3 reps - 20 sec hold Seated Table Piriformis Stretch - 1-2 x daily - 7 x weekly - 1 sets - 2-3 reps - 20 sec hold Prone Quadriceps Stretch with Strap - 1-2 x daily - 7 x weekly - 1 sets - 3 reps - 20 sec hold Step Up - 1 x daily - 7 x weekly - 3 sets - 10 reps Lateral Step Up - 1 x daily - 7 x weekly - 3 sets - 10 reps Backward Step Up - 1 x daily - 7 x weekly - 3 sets - 10 reps Single Leg Balance with Clock Reach - 1 x daily - 7 x weekly - 1 sets - 10 reps Tandem Stance on Foam Pad with Eyes Open - 1 x daily - 7 x weekly - 3 sets - 1 reps - 20-30 seconds hold Wall Squat - 1 x daily - 7 x weekly - 1 sets - 10 reps

## 2020-12-12 ENCOUNTER — Ambulatory Visit (INDEPENDENT_AMBULATORY_CARE_PROVIDER_SITE_OTHER): Payer: Medicare Other | Admitting: Physical Therapy

## 2020-12-12 ENCOUNTER — Other Ambulatory Visit: Payer: Self-pay

## 2020-12-12 DIAGNOSIS — M6281 Muscle weakness (generalized): Secondary | ICD-10-CM

## 2020-12-12 DIAGNOSIS — G8929 Other chronic pain: Secondary | ICD-10-CM

## 2020-12-12 DIAGNOSIS — R29898 Other symptoms and signs involving the musculoskeletal system: Secondary | ICD-10-CM

## 2020-12-12 DIAGNOSIS — M25561 Pain in right knee: Secondary | ICD-10-CM | POA: Diagnosis not present

## 2020-12-12 NOTE — Therapy (Signed)
[place], [place], [address] Phone: [phone]   Fax:  [phone]  Physical Therapy Treatment and Discharge  Patient Details  Name: Emily Mcdonald MRN: [id number] Date of Birth: 14-Jan-1956 Referring Provider (PT): Thekkekandam   Encounter Date: 12/12/2020   PT End of Session - 12/12/20 1007     Visit Number 6    Number of Visits 12    Date for PT Re-Evaluation 01/06/21    PT Start Time 0930    PT Stop Time 1008    PT Time Calculation (min) 38 min    Activity Tolerance Patient tolerated treatment well    Behavior During Therapy [facility] for tasks assessed/performed             Past Medical History:  Diagnosis Date   Arthritis 03-31-11   arthritis fingers, and toes more right sided   Cancer ([place]) 03-31-11   dx. endometrial cancer , surgery planned   Hypertension 03-31-11   tx. med   Obesity     Past Surgical History:  Procedure Laterality Date   ABDOMINAL HYSTERECTOMY  04/06/11   Robotic BSO, LND   TOTAL ABDOMINAL HYSTERECTOMY W/ BILATERAL SALPINGOOPHORECTOMY      There were no vitals filed for this visit.   Subjective Assessment - 12/12/20 0931     Subjective Pt has not taken meds for 3 days and states her Rt knee is more sore.    Patient Stated Goals decrease pain    Currently in Pain? Yes    Pain Score 3     Pain Location Knee    Pain Orientation Right    Pain Descriptors / Indicators Sore                [facility] PT Assessment - 12/12/20 0001       Assessment   Medical Diagnosis primary OA of right knee    Referring Provider (PT) Thekkekandam    Next MD Visit 12/24/20      Observation/Other Assessments   Focus on Therapeutic Outcomes (FOTO)  75      Strength   Overall Strength Comments bilat hip strength 4+/5    Right Knee Flexion 4+/5    Right Knee Extension 4+/5    Left Knee Flexion 4+/5    Left Knee Extension 4+/5      Palpation   Patella mobility  crepitus with mobility                           [facility] Adult PT Treatment/Exercise - 12/12/20 0001       Knee/Hip Exercises: Stretches   Sports administrator Right;2 reps;30 seconds   prone with strap   Hip Flexor Stretch Right;2 reps;Left;1 rep;20 seconds      Knee/Hip Exercises: Aerobic   Nustep L5 x 5 mins legs only for warm up      Knee/Hip Exercises: Standing   Lateral Step Up Right;2 sets;10 reps;Hand Hold: 0;Left    Forward Step Up Right;Left;2 sets;10 reps    Step Down Left;1 set;10 reps;Step Height: 4";Right   and retro step up with RLE   Wall Squat 10 reps    SLS SLS with toe taps x 10 bilat on foam    Other Standing Knee Exercises tandem stance on foam wiht head turns x 30 sec bilat      Manual Therapy   Soft tissue mobilization STM distal quad to reduce mm spasticity  PT Long Term Goals - 12/12/20 0937       PT LONG TERM GOAL #1   Title Pt will be independent in HEP    Status Achieved      PT LONG TERM GOAL #2   Title Pt will improve FOTO to >= 70 to demo improved functional mobility    Status Achieved      PT LONG TERM GOAL #3   Title Pt will tolerate standing for a full day at work with Rt knee pain <= 2/10    Status Achieved      PT LONG TERM GOAL #4   Title Pt will improve Rt SLS to >= 15 seconds    Status Achieved                   Plan - 12/12/20 1007     Clinical Impression Statement Pt has improved hip and knee strength, balance and activity tolerance. Pt is ready to d/c from PT and progress to HEP.    PT Next Visit Plan d/c    PT Home Exercise Plan [id number]    Consulted and Agree with Plan of Care Patient             Patient will benefit from skilled therapeutic intervention in order to improve the following deficits and impairments:     Visit Diagnosis: Muscle weakness (generalized)  Chronic pain of right knee  Other symptoms and signs involving the musculoskeletal  system     Problem List Patient Active Problem List   Diagnosis Date Noted   Foot pain, right 11/11/2016   Primary osteoarthritis of right knee 10/27/2016   Obesity (BMI 30-39.9) 01/04/2014   Endometrial cancer ([place]) 03/19/2011   RESTLESS LEG SYNDROME 11/28/2007   HYPERTENSION, BENIGN ESSENTIAL 03/17/2006   PHYSICAL THERAPY DISCHARGE SUMMARY  Visits from Start of Care: 6  Current functional level related to goals / functional outcomes: Pt has improved strength and balance and decreased pain   Remaining deficits: See above   Education / Equipment: HEP   Patient agrees to discharge. Patient goals were met. Patient is being discharged due to meeting the stated rehab goals. Brandonn Capelli, PT   Brittane Grudzinski 12/12/2020, 10:08 AM  [facility] [place], [place], [address] Phone: [phone]   Fax:  [phone]  Name: Emily Mcdonald MRN: [id number] Date of Birth: 1955-10-16

## 2020-12-12 NOTE — Patient Instructions (Signed)
Access Code: [email or web address] URL: [email or web address] Date: 12/12/2020 Prepared by: Isabelle Course  Exercises Figure 4 Bridge - 1 x daily - 7 x weekly - 2 sets - 10 reps Supine Piriformis Stretch with Foot on Ground - 1-2 x daily - 7 x weekly - 1 sets - 2-3 reps - 20 sec hold Seated Table Piriformis Stretch - 1-2 x daily - 7 x weekly - 1 sets - 2-3 reps - 20 sec hold Prone Quadriceps Stretch with Strap - 1-2 x daily - 7 x weekly - 1 sets - 3 reps - 20 sec hold Step Up - 1 x daily - 7 x weekly - 3 sets - 10 reps Lateral Step Up - 1 x daily - 7 x weekly - 3 sets - 10 reps Backward Step Up - 1 x daily - 7 x weekly - 3 sets - 10 reps Single Leg Balance with Clock Reach - 1 x daily - 7 x weekly - 1 sets - 10 reps Tandem Stance on Foam Pad with Eyes Open - 1 x daily - 7 x weekly - 3 sets - 1 reps - 20-30 seconds hold Wall Squat - 1 x daily - 7 x weekly - 1 sets - 10 reps

## 2020-12-24 ENCOUNTER — Ambulatory Visit: Payer: Medicare Other | Admitting: Sports Medicine

## 2021-01-02 ENCOUNTER — Other Ambulatory Visit: Payer: Self-pay | Admitting: Sports Medicine

## 2021-01-02 DIAGNOSIS — M1711 Unilateral primary osteoarthritis, right knee: Secondary | ICD-10-CM

## 2021-01-02 MED ORDER — MELOXICAM 15 MG PO TABS: ORAL_TABLET | ORAL | 3 refills | Status: DC

## 2021-01-05 DIAGNOSIS — I10 Essential (primary) hypertension: Secondary | ICD-10-CM

## 2021-01-05 NOTE — Progress Notes (Signed)
This encounter was created in error - please disregard.

## 2021-01-07 ENCOUNTER — Other Ambulatory Visit: Payer: Self-pay | Admitting: Family Medicine

## 2021-01-07 DIAGNOSIS — I10 Essential (primary) hypertension: Secondary | ICD-10-CM

## 2021-01-16 ENCOUNTER — Ambulatory Visit: Payer: Medicare Other | Admitting: Family Medicine

## 2021-03-03 ENCOUNTER — Encounter: Payer: Self-pay | Admitting: Family Medicine

## 2021-03-03 ENCOUNTER — Ambulatory Visit (INDEPENDENT_AMBULATORY_CARE_PROVIDER_SITE_OTHER): Payer: Medicare Other | Admitting: Family Medicine

## 2021-03-03 VITALS — BP 129/68 | HR 84 | Ht 63.0 in | Wt 208.0 lb

## 2021-03-03 DIAGNOSIS — Z23 Encounter for immunization: Secondary | ICD-10-CM

## 2021-03-03 DIAGNOSIS — I10 Essential (primary) hypertension: Secondary | ICD-10-CM | POA: Diagnosis not present

## 2021-03-03 MED ORDER — LOSARTAN POTASSIUM 100 MG PO TABS: 100.0000 mg | ORAL_TABLET | Freq: Every day | ORAL | 2 refills | Status: DC

## 2021-03-03 NOTE — Progress Notes (Signed)
Established Patient Office Visit  Subjective:  Patient ID: Emily Mcdonald, female    DOB: 11-05-1955  Age: 65 y.o. MRN: [id number]  CC:  Chief Complaint  Patient presents with   Hypertension     HPI Emily Mcdonald presents for   Hypertension- Pt denies chest pain, SOB, dizziness, or heart palpitations.  Taking meds as directed w/o problems.  Denies medication side effects.    She is still working.   Flu vac given today.   Past Medical History:  Diagnosis Date   Arthritis 03-31-11   arthritis fingers, and toes more right sided   Cancer ([place]) 03-31-11   dx. endometrial cancer , surgery planned   Hypertension 03-31-11   tx. med   Obesity     Past Surgical History:  Procedure Laterality Date   ABDOMINAL HYSTERECTOMY  04/06/11   Robotic BSO, LND   TOTAL ABDOMINAL HYSTERECTOMY W/ BILATERAL SALPINGOOPHORECTOMY      Family History  Problem Relation Age of Onset   Breast cancer Maternal Grandmother    Lymphoma Paternal Grandmother 27   Heart attack Mother 57   Diabetes Mother        late 60s.    Pulmonary fibrosis Mother        passed away from this   Diabetes Father    Diabetes Brother    Hypertension Brother    Diabetes Brother    Hypertension Brother    Lymphoma Brother    Pulmonary fibrosis Maternal Grandfather     Social History   Socioeconomic History   Marital status: Divorced    Spouse name: Not on file   Number of children: Not on file   Years of education: Not on file   Highest education level: Not on file  Occupational History   Occupation: Teacher    Comment: [employer]  Tobacco Use   Smoking status: Never   Smokeless tobacco: Never  Substance and Sexual Activity   Alcohol use: Not on file   Drug use: Not on file   Sexual activity: Never  Other Topics Concern   Not on file  Social History Narrative   1-2 caffeine drinks a day.  No regular exercise.     Social Determinants of Health   Financial Resource Strain: Not on  file  Food Insecurity: Not on file  Transportation Needs: Not on file  Physical Activity: Not on file  Stress: Not on file  Social Connections: Not on file  Intimate Partner Violence: Not on file    Outpatient Medications Prior to Visit  Medication Sig Dispense Refill   Cholecalciferol (D3-1000 PO) Take by mouth daily.      Cyanocobalamin (VITAMIN B-12 CR PO) Take 1 tablet by mouth daily.     meloxicam (MOBIC) 15 MG tablet One tab PO qAM with a meal for 2 weeks, then daily prn pain. 30 tablet 3   Multiple Vitamin (MULTIVITAMIN) tablet Take 1 tablet by mouth daily.     vitamin C (ASCORBIC ACID) 500 MG tablet Take 500 mg by mouth daily.     losartan (COZAAR) 100 MG tablet Take 1 tablet by mouth once daily 90 tablet 0   No facility-administered medications prior to visit.    Allergies  Allergen Reactions   Ace Inhibitors     REACTION: cough    ROS Review of Systems    Objective:    Physical Exam Constitutional:      Appearance: Normal appearance. She is well-developed.  HENT:  Head: Normocephalic and atraumatic.  Cardiovascular:     Rate and Rhythm: Normal rate and regular rhythm.     Heart sounds: Normal heart sounds.  Pulmonary:     Effort: Pulmonary effort is normal.     Breath sounds: Normal breath sounds.  Skin:    General: Skin is warm and dry.  Neurological:     Mental Status: She is alert and oriented to person, place, and time.  Psychiatric:        Behavior: Behavior normal.    BP 129/68   Pulse 84   Ht 5\' 3"  (1.6 m)   Wt 208 lb (94.3 kg)   LMP 10/16/2010   SpO2 100%   BMI 36.85 kg/m  Wt Readings from Last 3 Encounters:  03/03/21 208 lb (94.3 kg)  11/11/20 210 lb (95.3 kg)  07/17/20 210 lb (95.3 kg)     Health Maintenance Due  Topic Date Due   COVID-19 Vaccine (3 - Pfizer risk series) 09/27/2019    There are no preventive care reminders to display for this patient.  Lab Results  Component Value Date   TSH 2.172 01/24/2014   Lab  Results  Component Value Date   WBC 5.3 01/30/2020   HGB 13.7 01/30/2020   HCT 40.2 01/30/2020   MCV 83.8 01/30/2020   PLT 175 01/30/2020   Lab Results  Component Value Date   NA 143 07/17/2020   K 4.0 07/17/2020   CO2 28 07/17/2020   GLUCOSE 115 (H) 07/17/2020   BUN 19 07/17/2020   CREATININE 0.88 07/17/2020   BILITOT 0.6 01/30/2020   ALKPHOS 68 11/11/2016   AST 18 01/30/2020   ALT 14 01/30/2020   PROT 6.7 01/30/2020   ALBUMIN 4.2 11/11/2016   CALCIUM 9.7 07/17/2020   Lab Results  Component Value Date   CHOL 173 01/30/2020   Lab Results  Component Value Date   HDL 50 01/30/2020   Lab Results  Component Value Date   LDLCALC 103 (H) 01/30/2020   Lab Results  Component Value Date   TRIG 106 01/30/2020   Lab Results  Component Value Date   CHOLHDL 3.5 01/30/2020   Lab Results  Component Value Date   HGBA1C 5.4 07/23/2019      Assessment & Plan:   Problem List Items Addressed This Visit       Cardiovascular and Mediastinum   HYPERTENSION, BENIGN ESSENTIAL - Primary   Relevant Medications   losartan (COZAAR) 100 MG tablet   Other Relevant Orders   Lipid Panel w/reflex Direct LDL   COMPLETE METABOLIC PANEL WITH GFR   CBC   Other Visit Diagnoses     Need for immunization against influenza       Relevant Orders   Flu Vaccine QUAD High Dose(Fluad) (Completed)       Meds ordered this encounter  Medications   losartan (COZAAR) 100 MG tablet    Sig: Take 1 tablet (100 mg total) by mouth daily.    Dispense:  90 tablet    Refill:  2     Follow-up: Return in about 6 months (around 09/07/2021) for Hypertension.    Beatrice Lecher, MD

## 2021-09-08 ENCOUNTER — Encounter: Payer: Self-pay | Admitting: Family Medicine

## 2021-09-08 ENCOUNTER — Ambulatory Visit (INDEPENDENT_AMBULATORY_CARE_PROVIDER_SITE_OTHER): Payer: Medicare Other | Admitting: Family Medicine

## 2021-09-08 VITALS — BP 134/69 | HR 76 | Ht 63.0 in | Wt 211.0 lb

## 2021-09-08 DIAGNOSIS — I10 Essential (primary) hypertension: Secondary | ICD-10-CM | POA: Diagnosis not present

## 2021-09-08 NOTE — Progress Notes (Signed)
? ?  Established Patient Office Visit ? ?Subjective   ?Patient ID: Emily Mcdonald, female    DOB: Apr 04, 1956  Age: 66 y.o. MRN: [id number] ? ?Chief Complaint  ?Patient presents with  ? Annual Exam  ? ? ?HPI ? ?Hypertension- Pt denies chest pain, SOB, dizziness, or heart palpitations.  Taking meds as directed w/o problems.  Denies medication side effects.   ? ?She reports that she is actually doing well overall.  She forgot to come back and get her blood work done but will be happy to do so when school is out in a couple of weeks.  She still has problems with her knee and it does keep her from exercising.  But overall again she feels she is doing well. ? ? ? ?ROS ? ?  ?Objective:  ?  ? ?BP 134/69   Pulse 76   Ht '5\' 3"'$  (1.6 m)   Wt 211 lb (95.7 kg)   LMP 10/16/2010   SpO2 97%   BMI 37.38 kg/m?  ? ? ?Physical Exam ?Vitals and nursing note reviewed.  ?Constitutional:   ?   Appearance: She is well-developed.  ?HENT:  ?   Head: Normocephalic and atraumatic.  ?Cardiovascular:  ?   Rate and Rhythm: Normal rate and regular rhythm.  ?   Heart sounds: Normal heart sounds.  ?Pulmonary:  ?   Effort: Pulmonary effort is normal.  ?   Breath sounds: Normal breath sounds.  ?Skin: ?   General: Skin is warm and dry.  ?Neurological:  ?   Mental Status: She is alert and oriented to person, place, and time.  ?Psychiatric:     ?   Behavior: Behavior normal.  ? ? ? ?No results found for any visits on 09/08/21. ? ? ? ?The 10-year ASCVD risk score (Arnett DK, et al., 2019) is: 8.7% ? ?  ?Assessment & Plan:  ? ?Problem List Items Addressed This Visit   ? ?  ? Cardiovascular and Mediastinum  ? HYPERTENSION, BENIGN ESSENTIAL - Primary  ?  Well controlled. Continue current regimen. Follow up in  6 mo  ?Due for labs. She will go when school gets out.   ? ?  ?  ? Relevant Orders  ? Lipid Panel w/reflex Direct LDL  ? COMPLETE METABOLIC PANEL WITH GFR  ? CBC  ? ? ?Return in about 6 months (around 03/11/2022) for Hypertension.  ? ? ?Beatrice Lecher, MD ? ?

## 2021-09-08 NOTE — Assessment & Plan Note (Signed)
Well controlled. Continue current regimen. Follow up in  6 mo  ?Due for labs. She will go when school gets out.   ?

## 2021-10-23 ENCOUNTER — Encounter: Payer: Self-pay | Admitting: Physician Assistant

## 2021-10-23 DIAGNOSIS — E785 Hyperlipidemia, unspecified: Secondary | ICD-10-CM | POA: Insufficient documentation

## 2021-10-23 LAB — CBC
HCT: 45.4 % — ABNORMAL HIGH (ref 35.0–45.0)
Hemoglobin: 15.5 g/dL (ref 11.7–15.5)
MCH: 28.1 pg (ref 27.0–33.0)
MCHC: 34.1 g/dL (ref 32.0–36.0)
MCV: 82.2 fL (ref 80.0–100.0)
MPV: 9.8 fL (ref 7.5–12.5)
Platelets: 214 10*3/uL (ref 140–400)
RBC: 5.52 10*6/uL — ABNORMAL HIGH (ref 3.80–5.10)
RDW: 13.3 % (ref 11.0–15.0)
WBC: 6.2 10*3/uL (ref 3.8–10.8)

## 2021-10-23 LAB — LIPID PANEL W/REFLEX DIRECT LDL
Cholesterol: 191 mg/dL (ref <200)
HDL: 59 mg/dL (ref >=50)
LDL Cholesterol (Calc): 110 mg/dL (calc) — ABNORMAL HIGH
Non-HDL Cholesterol (Calc): 132 mg/dL (calc) — ABNORMAL HIGH (ref <130)
Total CHOL/HDL Ratio: 3.2 (calc) (ref <5.0)
Triglycerides: 112 mg/dL (ref <150)

## 2021-10-23 LAB — COMPLETE METABOLIC PANEL WITH GFR
AG Ratio: 1.4 (calc) (ref 1.0–2.5)
ALT: 16 U/L (ref 6–29)
AST: 19 U/L (ref 10–35)
Albumin: 4.4 g/dL (ref 3.6–5.1)
Alkaline phosphatase (APISO): 79 U/L (ref 37–153)
BUN: 19 mg/dL (ref 7–25)
CO2: 23 mmol/L (ref 20–32)
Calcium: 9.7 mg/dL (ref 8.6–10.4)
Chloride: 104 mmol/L (ref 98–110)
Creat: 0.89 mg/dL (ref 0.50–1.05)
Globulin: 3.1 g/dL (calc) (ref 1.9–3.7)
Glucose, Bld: 101 mg/dL — ABNORMAL HIGH (ref 65–99)
Potassium: 4.5 mmol/L (ref 3.5–5.3)
Sodium: 139 mmol/L (ref 135–146)
Total Bilirubin: 0.9 mg/dL (ref 0.2–1.2)
Total Protein: 7.5 g/dL (ref 6.1–8.1)
eGFR: 71 mL/min/{1.73_m2} (ref >=60)

## 2021-10-23 NOTE — Progress Notes (Signed)
Dr. Madilyn Fireman is out of the office.   My name is Iran Planas PA-C  Your glucose is better than one year ago.  Kidney and liver look good.   RBC and hematocrit a little elevated. Recheck in 1 month.   HDL, good cholesterol, looks GREAT.  LdL, bad cholesterol, look ok. However your 10 year CV risk below is still 8.6 percent. I would consider a daily statin to help decrease this risk. Thoughts?   Emily KitchenMarland KitchenThe 10-year ASCVD risk score (Arnett DK, et al., 2019) is: 8.6%   Values used to calculate the score:     Age: 34 years     Sex: Female     Is Non-Hispanic African American: No     Diabetic: No     Tobacco smoker: No     Systolic Blood Pressure: 892 mmHg     Is BP treated: Yes     HDL Cholesterol: 59 mg/dL     Total Cholesterol: 191 mg/dL

## 2021-11-26 ENCOUNTER — Other Ambulatory Visit: Payer: Self-pay | Admitting: Family Medicine

## 2021-11-26 DIAGNOSIS — I10 Essential (primary) hypertension: Secondary | ICD-10-CM

## 2021-12-30 ENCOUNTER — Encounter: Payer: Self-pay | Admitting: General Practice

## 2022-03-04 IMAGING — MG MM DIGITAL SCREENING BILAT W/ TOMO AND CAD
8 series · 8 of 24 positions shown · non-contrast
Comparison: Previous exam(s).

ACR Breast Density Category a: The breast tissue is almost entirely
fatty.

CLINICAL DATA: Screening.

EXAM:
DIGITAL SCREENING BILATERAL MAMMOGRAM WITH TOMOSYNTHESIS AND CAD
TECHNIQUE: Bilateral screening digital craniocaudal and mediolateral oblique
mammograms were obtained. Bilateral screening digital breast
tomosynthesis was performed. The images were evaluated with
computer-aided detection.

[L MLO synth-2D]
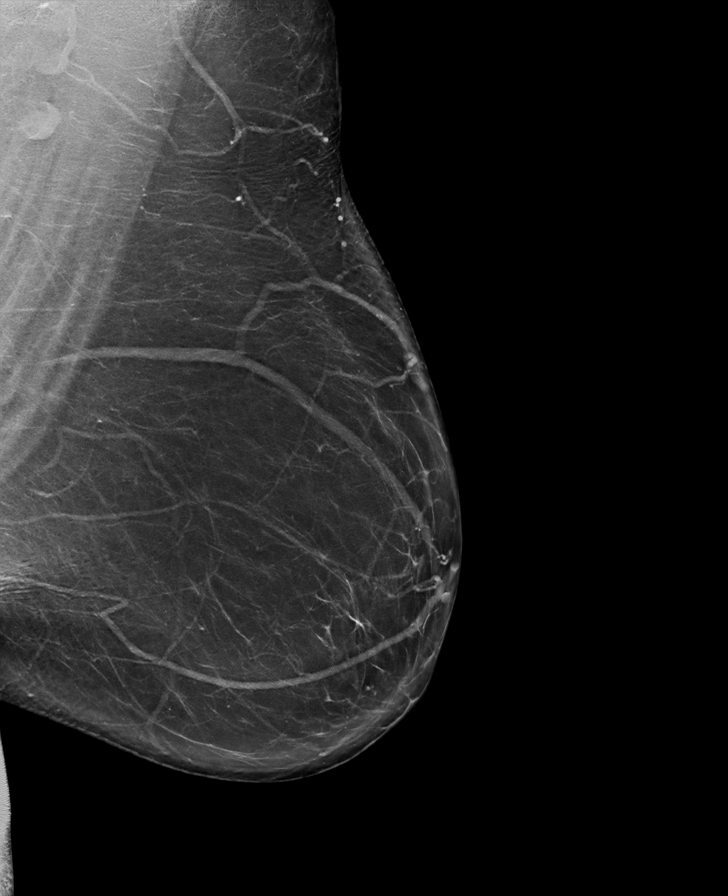

[R MLO synth-2D]
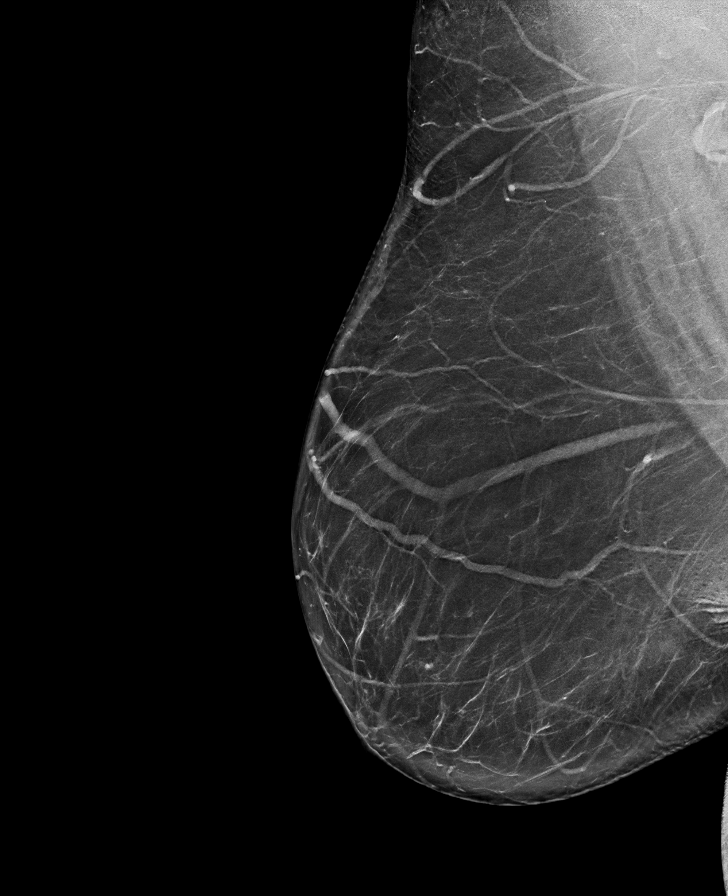

[L CC synth-2D]
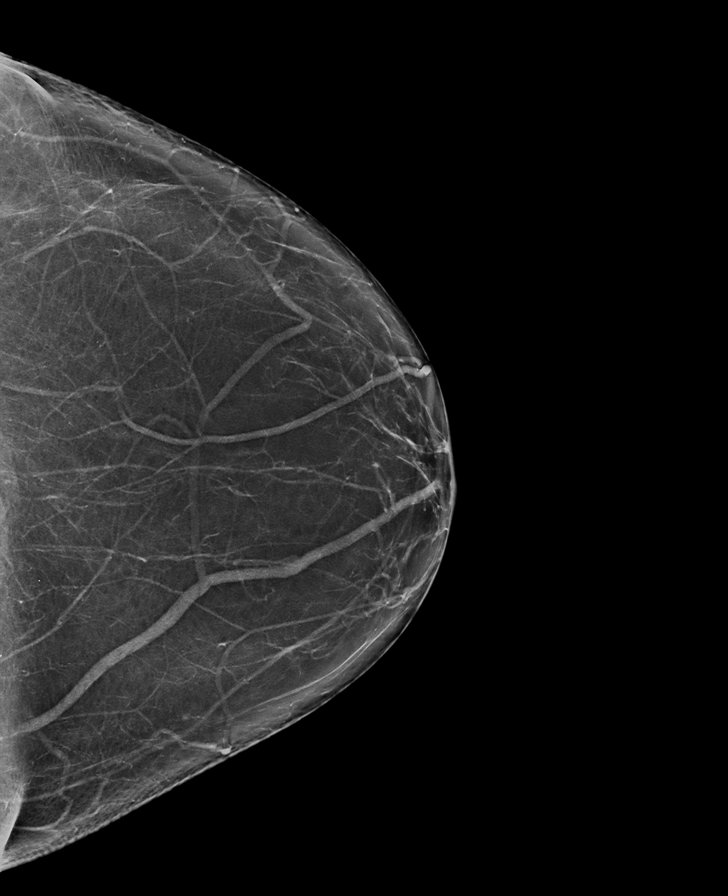

[R CC synth-2D]
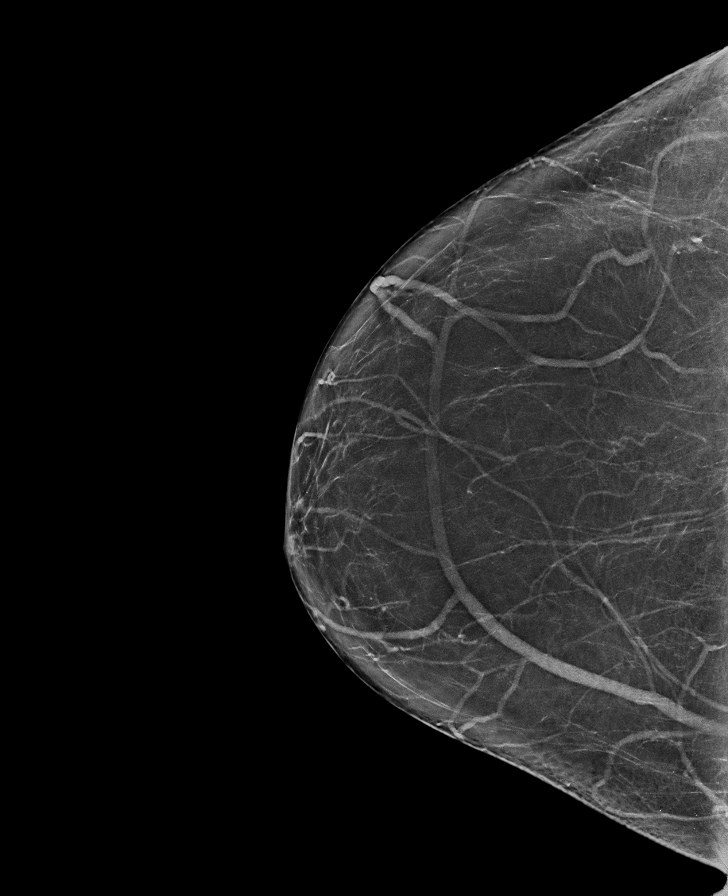

[R CC tomo · tomo slice 39/77.0]
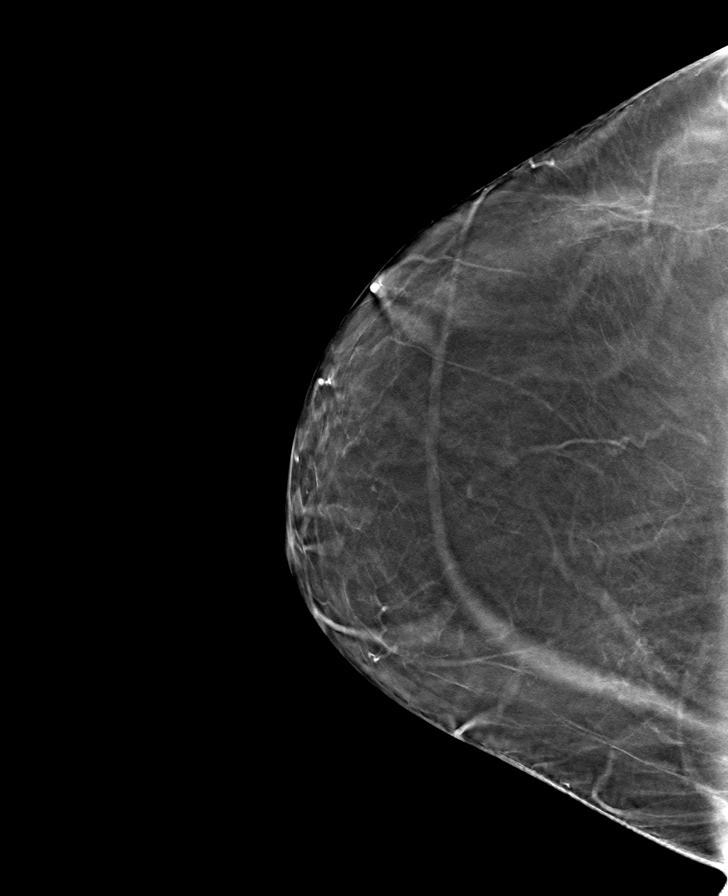

[R MLO tomo · tomo slice 43/85.0]
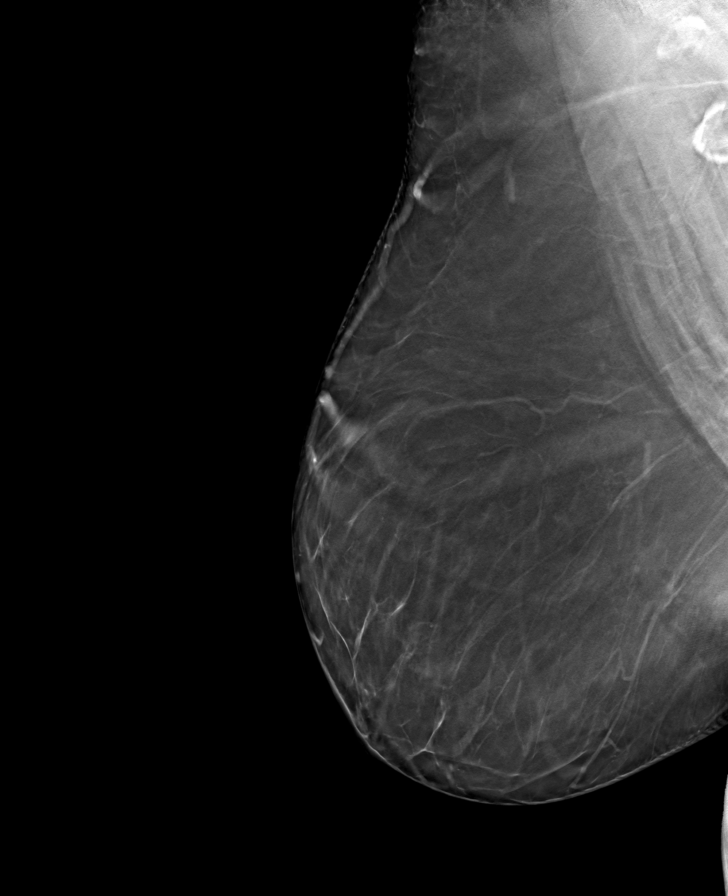

[L CC tomo · tomo slice 41/80.0]
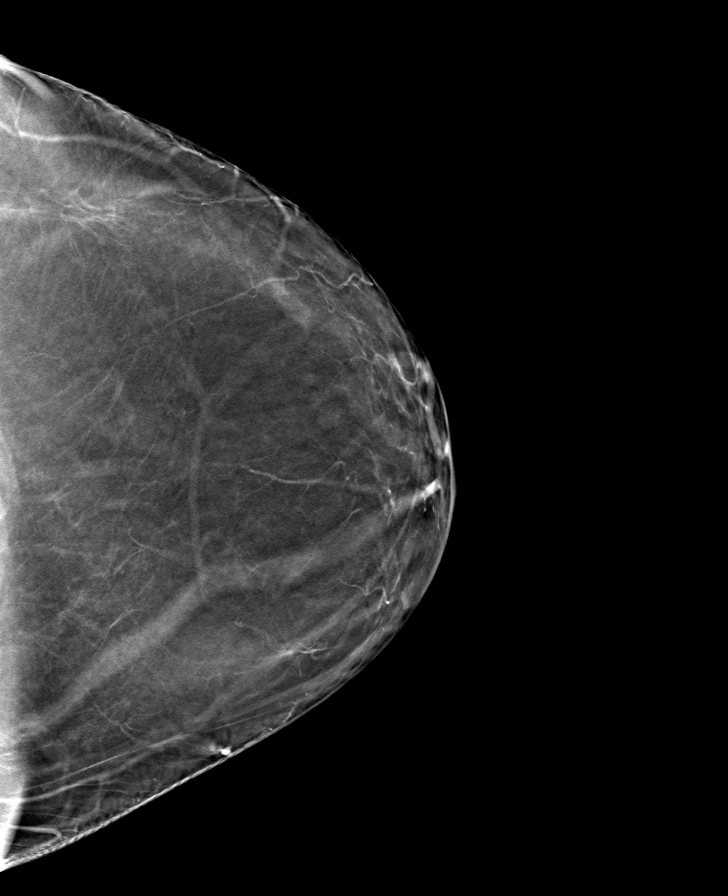

[L MLO tomo · tomo slice 46/91.0]
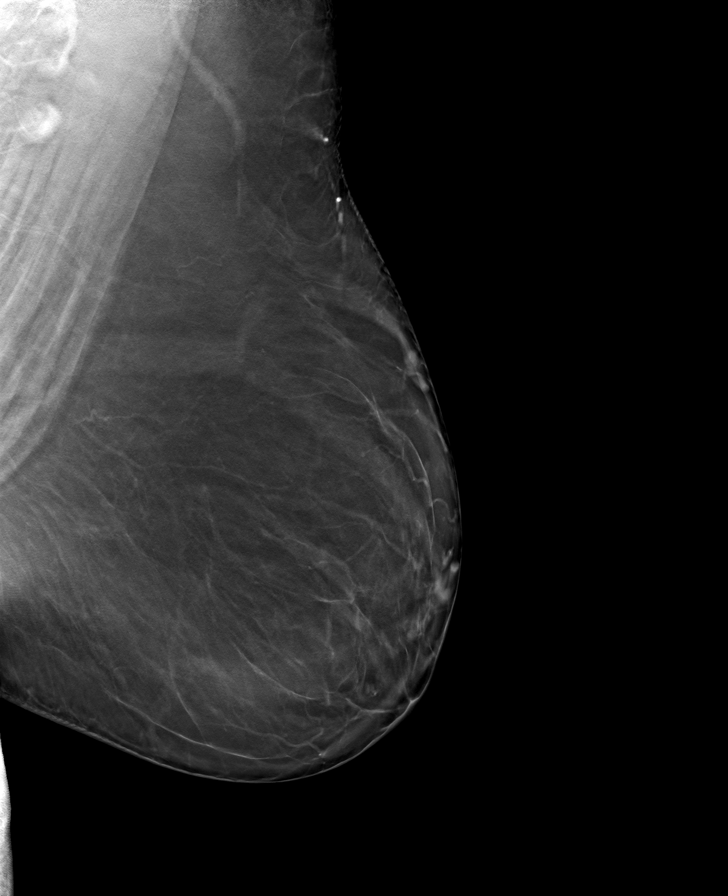

[8 of 24 positions shown; findings below may reference images not displayed]

FINDINGS: There are no findings suspicious for malignancy. The images were
evaluated with computer-aided detection.
IMPRESSION: No mammographic evidence of malignancy. A result letter of this
screening mammogram will be mailed directly to the patient.

RECOMMENDATION:
Screening mammogram in one year. (Code:[id number])

BI-RADS CATEGORY  1: Negative.

## 2022-03-12 ENCOUNTER — Ambulatory Visit (INDEPENDENT_AMBULATORY_CARE_PROVIDER_SITE_OTHER): Payer: Medicare Other | Admitting: Family Medicine

## 2022-03-12 ENCOUNTER — Encounter: Payer: Self-pay | Admitting: Family Medicine

## 2022-03-12 VITALS — BP 154/73 | HR 81 | Ht 63.0 in | Wt 199.0 lb

## 2022-03-12 DIAGNOSIS — Z23 Encounter for immunization: Secondary | ICD-10-CM

## 2022-03-12 DIAGNOSIS — E785 Hyperlipidemia, unspecified: Secondary | ICD-10-CM

## 2022-03-12 DIAGNOSIS — I10 Essential (primary) hypertension: Secondary | ICD-10-CM

## 2022-03-12 NOTE — Assessment & Plan Note (Signed)
Blood pressures are little borderline as far as being at goal with a systolic less than 228.  We discussed continuing to track them at home at least a couple of times a week if they are consistently above 130 then we do need to adjust her medication.  Also discussed the importance of regular exercise and continuing to work on low-salt diet which can also lower blood pressure 5-10 points and get her into a more therapeutic range.

## 2022-03-12 NOTE — Progress Notes (Signed)
   Established Patient Office Visit  Subjective   Patient ID: Emily Mcdonald, female    DOB: 23-Mar-1956  Age: 66 y.o. MRN: [id number]  Chief Complaint  Patient presents with   Hypertension    HPI  Hypertension- Pt denies chest pain, SOB, dizziness, or heart palpitations.  Taking meds as directed w/o problems.  Denies medication side effects.  Home blood pressures running between 264 and 158 systolic.  Diastolics running between 34 and 80.  Otherwise feels well.  Feels like blood pressure is a little elevated here because of traffic.    ROS    Objective:     BP (!) 154/73   Pulse 81   Ht '5\' 3"'$  (1.6 m)   Wt 199 lb (90.3 kg)   LMP 10/16/2010   SpO2 99%   BMI 35.25 kg/m    Physical Exam Vitals and nursing note reviewed.  Constitutional:      Appearance: She is well-developed.  HENT:     Head: Normocephalic and atraumatic.  Cardiovascular:     Rate and Rhythm: Normal rate and regular rhythm.     Heart sounds: Normal heart sounds.  Pulmonary:     Effort: Pulmonary effort is normal.     Breath sounds: Normal breath sounds.  Skin:    General: Skin is warm and dry.  Neurological:     Mental Status: She is alert and oriented to person, place, and time.  Psychiatric:        Behavior: Behavior normal.     No results found for any visits on 03/12/22.    The 10-year ASCVD risk score (Arnett DK, et al., 2019) is: 12.6%    Assessment & Plan:   Problem List Items Addressed This Visit       Cardiovascular and Mediastinum   HYPERTENSION, BENIGN ESSENTIAL - Primary    Blood pressures are little borderline as far as being at goal with a systolic less than 309.  We discussed continuing to track them at home at least a couple of times a week if they are consistently above 130 then we do need to adjust her medication.  Also discussed the importance of regular exercise and continuing to work on low-salt diet which can also lower blood pressure 5-10 points and get her into a  more therapeutic range.        Other   Dyslipidemia, goal LDL below 100   Other Visit Diagnoses     Need for immunization against influenza       Relevant Orders   Flu Vaccine QUAD High Dose(Fluad) (Completed)       Return in about 6 months (around 09/10/2022) for Wellness Exam.    Beatrice Lecher, MD

## 2022-07-05 ENCOUNTER — Ambulatory Visit (INDEPENDENT_AMBULATORY_CARE_PROVIDER_SITE_OTHER): Payer: Medicare Other | Admitting: Family Medicine

## 2022-07-05 DIAGNOSIS — Z0000 Encounter for general adult medical examination without abnormal findings: Secondary | ICD-10-CM | POA: Diagnosis not present

## 2022-07-05 DIAGNOSIS — Z1231 Encounter for screening mammogram for malignant neoplasm of breast: Secondary | ICD-10-CM

## 2022-07-05 NOTE — Patient Instructions (Addendum)
[facility] Maintenance Summary and Written Plan of Care  [name] ,  Thank you for allowing me to perform your Medicare Annual Wellness Visit and for your ongoing commitment to your health.   Health Maintenance & Immunization History Health Maintenance  Topic Date Due   COVID-19 Vaccine (3 - Pfizer risk series) 09/25/2022 (Originally 09/27/2019)   MAMMOGRAM  08/14/2022   Medicare Annual Wellness (AWV)  07/06/2023   DTaP/Tdap/Td (3 - Td or Tdap) 09/29/2024   COLONOSCOPY (Pts 45-92yr Insurance coverage will need to be confirmed)  12/03/2026   Pneumonia Vaccine 68 Years old  Completed   INFLUENZA VACCINE  Completed   DEXA SCAN  Completed   Hepatitis C Screening  Completed   Zoster Vaccines- Shingrix  Completed   HPV VACCINES  Aged Out   Immunization History  Administered Date(s) Administered   Fluad Quad(high Dose 68+) 03/03/2021, 03/12/2022   Hepatitis B 01/12/2019, 02/12/2019   Hepatitis B, ADULT 07/23/2019   Influenza Split 02/14/2012, 02/21/2018   Influenza Whole 03/17/2006, 12/24/2010   Influenza, Quadrivalent, Recombinant, Inj, Pf 02/21/2018   Influenza,inj,Quad PF,6+ Mos 01/04/2014, 03/21/2015, 01/05/2016, 02/01/2017, 01/17/2020   Influenza-Unspecified 02/12/2019   PFIZER(Purple Top)SARS-COV-2 Vaccination 08/09/2019, 08/30/2019   PNEUMOCOCCAL CONJUGATE-20 11/11/2020   Td 05/10/2005   Tdap 09/30/2014   Zoster Recombinat (Shingrix) 06/01/2018, 07/31/2018   Zoster, Live 01/05/2016    These are the patient goals that we discussed:  Goals Addressed               This Visit's Progress     Patient Stated (pt-stated)        Patient stated that she would like to continue to loose weight.         This is a list of Health Maintenance Items that are overdue or due now: There are no preventive care reminders to display for this patient. Screening mammography   Orders/Referrals Placed Today: Orders Placed This Encounter  Procedures    Mammogram 3D SCREEN BREAST BILATERAL    Standing Status:   Future    Standing Expiration Date:   07/06/2023    Scheduling Instructions:     Please call patient to schedule    Order Specific Question:   Reason for Exam (SYMPTOM  OR DIAGNOSIS REQUIRED)    Answer:   breast cancer screening    Order Specific Question:   Preferred imaging location?    Answer:   [facility]  (Contact our referral department at [phone] you have not spoken with someone about your referral appointment within the next 5 days)    Follow-up Plan llow-up Plan Follow-up with MHali Marry MD as planned Medicare wellness visit in one year Patient will access AVS on my chart.       Health Maintenance, Female Adopting a healthy lifestyle and getting preventive care are important in promoting health and wellness. Ask your health care provider about: The right schedule for you to have regular tests and exams. Things you can do on your own to prevent diseases and keep yourself healthy. What should I know about diet, weight, and exercise? Eat a healthy diet  Eat a diet that includes plenty of vegetables, fruits, low-fat dairy products, and lean protein. Do not eat a lot of foods that are high in solid fats, added sugars, or sodium. Maintain a healthy weight Body mass index (BMI) is used to identify weight problems. It estimates body fat based on height and weight. Your health care provider can  help determine your BMI and help you achieve or maintain a healthy weight. Get regular exercise Get regular exercise. This is one of the most important things you can do for your health. Most adults should: Exercise for at least 150 minutes each week. The exercise should increase your heart rate and make you sweat (moderate-intensity exercise). Do strengthening exercises at least twice a week. This is in addition to the moderate-intensity exercise. Spend less time sitting. Even light physical  activity can be beneficial. Watch cholesterol and blood lipids Have your blood tested for lipids and cholesterol at 68 years of age, then have this test every 5 years. Have your cholesterol levels checked more often if: Your lipid or cholesterol levels are high. You are older than 68 years of age. You are at high risk for heart disease. What should I know about cancer screening? Depending on your health history and family history, you may need to have cancer screening at various ages. This may include screening for: Breast cancer. Cervical cancer. Colorectal cancer. Skin cancer. Lung cancer. What should I know about heart disease, diabetes, and high blood pressure? Blood pressure and heart disease High blood pressure causes heart disease and increases the risk of stroke. This is more likely to develop in people who have high blood pressure readings or are overweight. Have your blood pressure checked: Every 3-5 years if you are 68-39 years of age. Every year if you are 72 years old or older. Diabetes Have regular diabetes screenings. This checks your fasting blood sugar level. Have the screening done: Once every three years after age 39 if you are at a normal weight and have a low risk for diabetes. More often and at a younger age if you are overweight or have a high risk for diabetes. What should I know about preventing infection? Hepatitis B If you have a higher risk for hepatitis B, you should be screened for this virus. Talk with your health care provider to find out if you are at risk for hepatitis B infection. Hepatitis C Testing is recommended for: Everyone born from [birth year] through 1966. Anyone with known risk factors for hepatitis C. Sexually transmitted infections (STIs) Get screened for STIs, including gonorrhea and chlamydia, if: You are sexually active and are younger than 68 years of age. You are older than 68 years of age and your health care provider tells you that you  are at risk for this type of infection. Your sexual activity has changed since you were last screened, and you are at increased risk for chlamydia or gonorrhea. Ask your health care provider if you are at risk. Ask your health care provider about whether you are at high risk for HIV. Your health care provider may recommend a prescription medicine to help prevent HIV infection. If you choose to take medicine to prevent HIV, you should first get tested for HIV. You should then be tested every 3 months for as long as you are taking the medicine. Pregnancy If you are about to stop having your period (premenopausal) and you may become pregnant, seek counseling before you get pregnant. Take 400 to 800 micrograms (mcg) of folic acid every day if you become pregnant. Ask for birth control (contraception) if you want to prevent pregnancy. Osteoporosis and menopause Osteoporosis is a disease in which the bones lose minerals and strength with aging. This can result in bone fractures. If you are 64 years old or older, or if you are at risk for osteoporosis and  fractures, ask your health care provider if you should: Be screened for bone loss. Take a calcium or vitamin D supplement to lower your risk of fractures. Be given hormone replacement therapy (HRT) to treat symptoms of menopause. Follow these instructions at home: Alcohol use Do not drink alcohol if: Your health care provider tells you not to drink. You are pregnant, may be pregnant, or are planning to become pregnant. If you drink alcohol: Limit how much you have to: 0-1 drink a day. Know how much alcohol is in your drink. In the [place] one drink equals one 12 oz bottle of beer (355 mL), one 5 oz glass of wine (148 mL), or one 1 oz glass of hard liquor (44 mL). Lifestyle Do not use any products that contain nicotine or tobacco. These products include cigarettes, chewing tobacco, and vaping devices, such as e-cigarettes. If you need help quitting,  ask your health care provider. Do not use street drugs. Do not share needles. Ask your health care provider for help if you need support or information about quitting drugs. General instructions Schedule regular health, dental, and eye exams. Stay current with your vaccines. Tell your health care provider if: You often feel depressed. You have ever been abused or do not feel safe at home. Summary Adopting a healthy lifestyle and getting preventive care are important in promoting health and wellness. Follow your health care provider's instructions about healthy diet, exercising, and getting tested or screened for diseases. Follow your health care provider's instructions on monitoring your cholesterol and blood pressure. This information is not intended to replace advice given to you by your health care provider. Make sure you discuss any questions you have with your health care provider. Document Revised: 09/15/2020 Document Reviewed: 09/15/2020 Elsevier Patient Education  [place].

## 2022-07-05 NOTE — Progress Notes (Signed)
MEDICARE ANNUAL WELLNESS VISIT  07/05/2022  Telephone Visit Disclaimer This Medicare AWV was conducted by telephone due to national recommendations for restrictions regarding the COVID-19 Pandemic (e.g. social distancing).  I verified, using two identifiers, that I am speaking with Emily Mcdonald or their authorized healthcare agent. I discussed the limitations, risks, security, and privacy concerns of performing an evaluation and management service by telephone and the potential availability of an in-person appointment in the future. The patient expressed understanding and agreed to proceed.  Location of Patient: Home Location of Provider (nurse):  Provider home  Subjective:    Emily Mcdonald is a 67 y.o. female patient of Metheney, Rene Kocher, MD who had a Medicare Annual Wellness Visit today via telephone. Emily Mcdonald is Retired and lives alone. she has 2 children. she reports that she is socially active and does interact with friends/family regularly. she is minimally physically active and enjoys reading.  Patient Care Team: [name], MD as PCP - Alexia Freestone, MD as Attending Physician (Gynecology)     07/05/2022    2:25 PM 12/30/2016    5:02 PM 05/07/2016    1:13 PM 01/04/2014    4:07 PM 04/06/2011    1:39 PM 03/31/2011    1:37 PM  Advanced Directives  Does Patient Have a Medical Advance Directive? No No No No Patient does not have advance directive Patient does not have advance directive  Would patient like information on creating a medical advance directive? No - Patient declined No - Patient declined  No - patient declined information    Pre-existing out of facility DNR order (yellow form or pink MOST form)     No No    Hospital Utilization Over the Past 12 Months: # of hospitalizations0 or ER visits: 0 # of surgeries: 0  Review of Systems    Patient reports that her overall health is unchanged compared to last year.  History obtained from  chart review and the patient  Patient Reported Readings (BP, Pulse, CBG, Weight, etc) none  Pain Assessment Pain : No/denies pain     Current Medications & Allergies (verified) Allergies as of 07/05/2022       Reactions   Ace Inhibitors    REACTION: cough        Medication List        Accurate as of July 05, 2022  2:35 PM. If you have any questions, ask your nurse or doctor.          ascorbic acid 500 MG tablet Commonly known as: VITAMIN C Take 500 mg by mouth daily.   CALCIUM 500 PO Take 500 mg by mouth.   D3-1000 PO Take by mouth daily.   losartan 100 MG tablet Commonly known as: COZAAR Take 1 tablet by mouth once daily   meloxicam 15 MG tablet Commonly known as: MOBIC One tab PO qAM with a meal for 2 weeks, then daily prn pain.   multivitamin tablet Take 1 tablet by mouth daily.   VITAMIN B-12 CR PO Take 1 tablet by mouth daily.        History (reviewed): Past Medical History:  Diagnosis Date   Allergy 10/74   Arthritis 03/31/2011   arthritis fingers, and toes more right sided   Cancer ([place]) 03/31/2011   dx. endometrial cancer , surgery planned   Hypertension 03/31/2011   tx. med   Obesity    Past Surgical History:  Procedure Laterality Date   ABDOMINAL HYSTERECTOMY  04/06/2011   Robotic BSO, LND   TOTAL ABDOMINAL HYSTERECTOMY W/ BILATERAL SALPINGOOPHORECTOMY     Family History  Problem Relation Age of Onset   Breast cancer Maternal Grandmother    Lymphoma Paternal Grandmother 56   Heart attack Mother 69   Diabetes Mother        late 47s.    Pulmonary fibrosis Mother        passed away from this   Diabetes Father    Diabetes Brother    Hypertension Brother    Diabetes Brother    Hypertension Brother    Lymphoma Brother    Pulmonary fibrosis Maternal Grandfather    Social History   Socioeconomic History   Marital status: Divorced    Spouse name: Not on file   Number of children: 2   Years of education: 16    Highest education level: Bachelor's degree (e.g., BA, AB, BS)  Occupational History   Occupation: Teacher    Comment: [employer]  Tobacco Use   Smoking status: Never   Smokeless tobacco: Never  Substance and Sexual Activity   Alcohol use: Never   Drug use: Never   Sexual activity: Never  Other Topics Concern   Not on file  Social History Narrative   Lives alone. She enjoys reading.   Social Determinants of Health   Financial Resource Strain: Low Risk  (06/28/2022)   Overall Financial Resource Strain (CARDIA)    Difficulty of Paying Living Expenses: Not hard at all  Food Insecurity: No Food Insecurity (06/28/2022)   Hunger Vital Sign    Worried About Running Out of Food in the Last Year: Never true    Ran Out of Food in the Last Year: Never true  Transportation Needs: No Transportation Needs (06/28/2022)   PRAPARE - Hydrologist (Medical): No    Lack of Transportation (Non-Medical): No  Physical Activity: Inactive (06/28/2022)   Exercise Vital Sign    Days of Exercise per Week: 0 days    Minutes of Exercise per Session: 0 min  Stress: No Stress Concern Present (07/05/2022)   [place]    Feeling of Stress : Not at all  Social Connections: Moderately Integrated (07/05/2022)   Social Connection and Isolation Panel [NHANES]    Frequency of Communication with Friends and Family: Twice a week    Frequency of Social Gatherings with Friends and Family: Twice a week    Attends Religious Services: More than 4 times per year    Active Member of [employer] or Organizations: Yes    Attends Archivist Meetings: More than 4 times per year    Marital Status: Divorced    Activities of Daily Living    06/28/2022   12:58 PM  In your present state of health, do you have any difficulty performing the following activities:  Hearing? 0  Vision? 0  Difficulty concentrating or  making decisions? 0  Walking or climbing stairs? 0  Dressing or bathing? 0  Doing errands, shopping? 0  Preparing Food and eating ? N  Using the Toilet? N  In the past six months, have you accidently leaked urine? Y  Do you have problems with loss of bowel control? N  Managing your Medications? N  Managing your Finances? N  Housekeeping or managing your Housekeeping? N    Patient Education/ Literacy How often do you need to have someone help you when you read instructions,  pamphlets, or other written materials from your doctor or pharmacy?: 1 - Never What is the last grade level you completed in school?: Graduated from college  Exercise Current Exercise Habits: The patient does not participate in regular exercise at present, Exercise limited by: None identified  Diet Patient reports consuming 2 meals a day and 0 snack(s) a day Patient reports that her primary diet is: Regular Patient reports that she does have regular access to food.   Depression Screen    07/05/2022    2:25 PM 03/12/2022    4:47 PM 11/11/2020    9:49 AM 07/17/2020    3:53 PM 12/07/2018    3:08 PM 06/01/2018    4:05 PM 12/13/2016    3:08 PM  PHQ 2/9 Scores  PHQ - 2 Score 0 0 0 0 0 0 0     Fall Risk    07/05/2022    2:25 PM 06/28/2022   12:58 PM 03/12/2022    4:47 PM 11/11/2020    9:48 AM 11/11/2020    9:37 AM  Fall Risk   Falls in the past year? 0 0 0 0 0  Number falls in past yr: 0  0 0 0  Injury with Fall? 0  0 0 0  Risk for fall due to : No Fall Risks  No Fall Risks No Fall Risks No Fall Risks  Follow up Falls evaluation completed  Falls evaluation completed Falls prevention discussed;Falls evaluation completed Follow up appointment;Falls evaluation completed     Objective:  Emily Mcdonald seemed alert and oriented and she participated appropriately during our telephone visit.  Blood Pressure Weight BMI  BP Readings from Last 3 Encounters:  03/12/22 (!) 154/73  09/08/21 134/69  03/03/21 129/68   Wt  Readings from Last 3 Encounters:  03/12/22 199 lb (90.3 kg)  09/08/21 211 lb (95.7 kg)  03/03/21 208 lb (94.3 kg)   BMI Readings from Last 1 Encounters:  03/12/22 35.25 kg/m    *Unable to obtain current vital signs, weight, and BMI due to telephone visit type  Hearing/Vision  Emily Mcdonald did not seem to have difficulty with hearing/understanding during the telephone conversation Reports that she has not had a formal eye exam by an eye care professional within the past year Reports that she has not had a formal hearing evaluation within the past year *Unable to fully assess hearing and vision during telephone visit type  Cognitive Function:    07/05/2022    2:29 PM 11/11/2020   10:25 AM  6CIT Screen  What Year? 0 points 0 points  What month? 0 points 0 points  What time? 0 points 0 points  Count back from 20 0 points 0 points  Months in reverse 0 points 0 points  Repeat phrase 0 points 0 points  Total Score 0 points 0 points   (Normal:0-7, Significant for Dysfunction: >8)  Normal Cognitive Function Screening: Yes   Immunization & Health Maintenance Record Immunization History  Administered Date(s) Administered   Fluad Quad(high Dose 65+) 03/03/2021, 03/12/2022   Hepatitis B 01/12/2019, 02/12/2019   Hepatitis B, ADULT 07/23/2019   Influenza Split 02/14/2012, 02/21/2018   Influenza Whole 03/17/2006, 12/24/2010   Influenza, Quadrivalent, Recombinant, Inj, Pf 02/21/2018   Influenza,inj,Quad PF,6+ Mos 01/04/2014, 03/21/2015, 01/05/2016, 02/01/2017, 01/17/2020   Influenza-Unspecified 02/12/2019   PFIZER(Purple Top)SARS-COV-2 Vaccination 08/09/2019, 08/30/2019   PNEUMOCOCCAL CONJUGATE-20 11/11/2020   Td 05/10/2005   Tdap 09/30/2014   Zoster Recombinat (Shingrix) 06/01/2018, 07/31/2018   Zoster, Live 01/05/2016  Health Maintenance  Topic Date Due   COVID-19 Vaccine (3 - Pfizer risk series) 09/25/2022 (Originally 09/27/2019)   MAMMOGRAM  08/14/2022   Medicare Annual Wellness  (AWV)  07/06/2023   DTaP/Tdap/Td (3 - Td or Tdap) 09/29/2024   COLONOSCOPY (Pts 45-32yr Insurance coverage will need to be confirmed)  12/03/2026   Pneumonia Vaccine 68 Years old  Completed   INFLUENZA VACCINE  Completed   DEXA SCAN  Completed   Hepatitis C Screening  Completed   Zoster Vaccines- Shingrix  Completed   HPV VACCINES  Aged Out       Assessment  This is a routine wellness examination for Emily Mcdonald  Health Maintenance: Due or Overdue There are no preventive care reminders to display for this patient.   Emily Bastdoes not need a referral for Community Assistance: Care Management:   no Social Work:    no Prescription Assistance:  no Nutrition/Diabetes Education:  no   Plan:  Personalized Goals  Goals Addressed               This Visit's Progress     Patient Stated (pt-stated)        Patient stated that she would like to continue to loose weight.       Personalized Health Maintenance & Screening Recommendations  Screening mammography  Lung Cancer Screening Recommended: no (Low Dose CT Chest recommended if Age 68-80years, 30 pack-year currently smoking OR have quit w/in past 15 years) Hepatitis C Screening recommended: no HIV Screening recommended: no  Advanced Directives: Written information was not prepared per patient's request.  Referrals & Orders Orders Placed This Encounter  Procedures   Mammogram 3D SCREEN BREAST BILATERAL    Follow-up Plan Follow-up with MHali Marry MD as planned Medicare wellness visit in one year Patient will access AVS on my chart.    I have personally reviewed and noted the following in the patient's chart:   Medical and social history Use of alcohol, tobacco or illicit drugs  Current medications and supplements Functional ability and status Nutritional status Physical activity Advanced directives List of other physicians Hospitalizations, surgeries, and ER visits in previous 12  months Vitals Screenings to include cognitive, depression, and falls Referrals and appointments  In addition, I have reviewed and discussed with Emily Bastcertain preventive protocols, quality metrics, and best practice recommendations. A written personalized care plan for preventive services as well as general preventive health recommendations is available and can be mailed to the patient at her request.      BTinnie Gens RN BSN  07/05/2022

## 2022-07-12 ENCOUNTER — Ambulatory Visit (INDEPENDENT_AMBULATORY_CARE_PROVIDER_SITE_OTHER): Payer: Medicare Other | Admitting: Family Medicine

## 2022-07-12 ENCOUNTER — Encounter: Payer: Self-pay | Admitting: Family Medicine

## 2022-07-12 VITALS — BP 148/86 | HR 74 | Ht 63.0 in | Wt 197.0 lb

## 2022-07-12 DIAGNOSIS — I10 Essential (primary) hypertension: Secondary | ICD-10-CM | POA: Diagnosis not present

## 2022-07-12 MED ORDER — LOSARTAN POTASSIUM-HCTZ 100-25 MG PO TABS: 1.0000 | ORAL_TABLET | Freq: Every day | ORAL | 1 refills | Status: DC

## 2022-07-12 NOTE — Patient Instructions (Signed)
Please go to the labs in about a week after starting the new medication.

## 2022-07-12 NOTE — Assessment & Plan Note (Addendum)
Blood pressure is elevated today but it looks similar to what she is most likely been getting previously.  We discussed adding hydrochlorothiazide to her current losartan.  Follow-up in about 1 month for repeat blood pressure check she is getting to the lab in about 1 week after starting new medication to check a BMP to make sure kidney function potassium are stable.  Encouraged her to start exercising more that this will typically reduce blood pressure by an additional 5-10 points.

## 2022-07-12 NOTE — Progress Notes (Signed)
   Established Patient Office Visit  Subjective   Patient ID: Emily Mcdonald, female    DOB: 05/08/1956  Age: 67 y.o. MRN: [id number]  Chief Complaint  Patient presents with   Hypertension    HPI  She is here today for follow-up of blood pressure last time I saw her in the fall her blood pressures were mildly elevated in the 140s.  She has been keeping an eye on it here there and it still elevated in the 140s she said the highest she saw was 149 but most of the time it is in the low 140s.  No chest pain or shortness of breath.  She is taking her medication consistently and usually takes it in the morning.  No recent changes she has not been exercising recently.  She has not had any lower extremity swelling.\   She is planning on going downstairs to schedule her mammogram.  She is no longer taking the meloxicam she has been taking an over-the-counter supplement for her knee and she says it is actually really been helping.  So she is just been using that.     ROS    Objective:     BP (!) 148/86   Pulse 74   Ht '5\' 3"'$  (1.6 m)   Wt 197 lb (89.4 kg)   LMP 10/16/2010   SpO2 98%   BMI 34.90 kg/m    Physical Exam Vitals and nursing note reviewed.  Constitutional:      Appearance: She is well-developed.  HENT:     Head: Normocephalic and atraumatic.  Cardiovascular:     Rate and Rhythm: Normal rate and regular rhythm.     Heart sounds: Normal heart sounds.  Pulmonary:     Effort: Pulmonary effort is normal.     Breath sounds: Normal breath sounds.  Musculoskeletal:        General: No swelling.  Skin:    General: Skin is warm and dry.  Neurological:     Mental Status: She is alert and oriented to person, place, and time.  Psychiatric:        Behavior: Behavior normal.      No results found for any visits on 07/12/22.    The 10-year ASCVD risk score (Arnett DK, et al., 2019) is: 11.6%    Assessment & Plan:   Problem List Items Addressed This Visit        Cardiovascular and Mediastinum   HYPERTENSION, BENIGN ESSENTIAL - Primary    Blood pressure is elevated today but it looks similar to what she is most likely been getting previously.  We discussed adding hydrochlorothiazide to her current losartan.  Follow-up in about 1 month for repeat blood pressure check she is getting to the lab in about 1 week after starting new medication to check a BMP to make sure kidney function potassium are stable.  Encouraged her to start exercising more that this will typically reduce blood pressure by an additional 5-10 points.      Relevant Medications   losartan-hydrochlorothiazide (HYZAAR) 100-25 MG tablet   Other Relevant Orders   BASIC METABOLIC PANEL WITH GFR     Return in about 4 weeks (around 08/09/2022) for Nurse visit for BP check. Beatrice Lecher, MD

## 2022-08-09 ENCOUNTER — Ambulatory Visit (INDEPENDENT_AMBULATORY_CARE_PROVIDER_SITE_OTHER): Payer: Medicare Other | Admitting: Family Medicine

## 2022-08-09 VITALS — BP 122/72 | HR 78 | Ht 63.0 in

## 2022-08-09 DIAGNOSIS — I10 Essential (primary) hypertension: Secondary | ICD-10-CM

## 2022-08-09 NOTE — Progress Notes (Signed)
   Established Patient Office Visit  Subjective   Patient ID: Emily Mcdonald, female    DOB: 09/18/1955  Age: 67 y.o. MRN: [id number]  Chief Complaint  Patient presents with   Hypertension    BP check - nurse visit.     HPI  Hypertension- BP check nurse visit.  Patient denies chest pain, shortness of breath, dizziness, palpitations, or problems with medication. Patient will have blood work drawn today while in office.  ROS    Objective:     BP (!) 141/59 (BP Location: Left Arm, Patient Position: Sitting, Cuff Size: Large)   Pulse 78   Ht 5\' 3"  (1.6 m)   LMP 10/16/2010   SpO2 98%   BMI 34.90 kg/m    Physical Exam   No results found for any visits on 08/09/22.    The 10-year ASCVD risk score (Arnett DK, et al., 2019) is: 10.6%    Assessment & Plan:   BP check - nurse  visit. Initial BP reading  141/59 . Second BP reading = 122/72. Per Dr. Madilyn Fireman, MD- continue current mediation regimen and keep upcoming  appt 09/10/22.  Problem List Items Addressed This Visit   None   No follow-ups on file.    Rae Lips, LPN

## 2022-08-09 NOTE — Progress Notes (Signed)
Blood pressure looks phenomenal today.  Continue current regimen. 

## 2022-08-09 NOTE — Patient Instructions (Signed)
Continue current medication regimen and return at scheduled appointment 09/30/2022.

## 2022-08-10 LAB — BASIC METABOLIC PANEL WITH GFR
BUN: 17 mg/dL (ref 7–25)
CO2: 32 mmol/L (ref 20–32)
Calcium: 9.7 mg/dL (ref 8.6–10.4)
Chloride: 102 mmol/L (ref 98–110)
Creat: 0.77 mg/dL (ref 0.50–1.05)
Glucose, Bld: 115 mg/dL — ABNORMAL HIGH (ref 65–99)
Potassium: 4.4 mmol/L (ref 3.5–5.3)
Sodium: 140 mmol/L (ref 135–146)
eGFR: 84 mL/min/{1.73_m2} (ref >=60)

## 2022-08-10 NOTE — Progress Notes (Signed)
Your lab work is within acceptable range and there are no concerning findings.   ?

## 2022-09-01 ENCOUNTER — Ambulatory Visit: Payer: Medicare Other

## 2022-09-01 DIAGNOSIS — Z1231 Encounter for screening mammogram for malignant neoplasm of breast: Secondary | ICD-10-CM | POA: Diagnosis not present

## 2022-09-01 DIAGNOSIS — Z0000 Encounter for general adult medical examination without abnormal findings: Secondary | ICD-10-CM

## 2022-09-03 NOTE — Progress Notes (Signed)
Please call patient. Normal mammogram.  Repeat in 1 year.  

## 2022-09-10 ENCOUNTER — Encounter: Payer: Self-pay | Admitting: Family Medicine

## 2022-09-10 ENCOUNTER — Ambulatory Visit (INDEPENDENT_AMBULATORY_CARE_PROVIDER_SITE_OTHER): Payer: Medicare Other | Admitting: Family Medicine

## 2022-09-10 VITALS — BP 122/65 | HR 82 | Ht 63.0 in | Wt 199.0 lb

## 2022-09-10 DIAGNOSIS — Z0000 Encounter for general adult medical examination without abnormal findings: Secondary | ICD-10-CM | POA: Diagnosis not present

## 2022-09-10 DIAGNOSIS — I10 Essential (primary) hypertension: Secondary | ICD-10-CM | POA: Diagnosis not present

## 2022-09-10 DIAGNOSIS — E785 Hyperlipidemia, unspecified: Secondary | ICD-10-CM

## 2022-09-10 NOTE — Progress Notes (Signed)
Complete physical exam  Patient: Emily Mcdonald   DOB: February 09, 1956   67 y.o. Female  MRN: [id number]  Subjective:    Chief Complaint  Patient presents with   Annual Exam    Emily Mcdonald is a 67 y.o. female who presents today for a complete physical exam. She reports consuming a general diet. The patient does not participate in regular exercise at present. She generally feels well. She reports sleeping fairly well. She does not have additional problems to discuss today.   She does have a couple skin lesions on her right anterior forearm that seems to be changing it is little bit more raised than it used to be.   Most recent fall risk assessment:    09/10/2022    4:10 PM  Fall Risk   Falls in the past year? 0  Number falls in past yr: 0  Injury with Fall? 0  Risk for fall due to : No Fall Risks  Follow up Falls evaluation completed     Most recent depression screenings:    09/10/2022    4:10 PM 07/05/2022    2:25 PM  PHQ 2/9 Scores  PHQ - 2 Score 0 0        Patient Care Team: Agapito Games, MD as PCP - General De Blanch, MD as Attending Physician (Gynecology)   Outpatient Medications Prior to Visit  Medication Sig   Calcium Carbonate (CALCIUM 500 PO) Take 500 mg by mouth.   Cholecalciferol (D3-1000 PO) Take by mouth daily.    Cyanocobalamin (VITAMIN B-12 CR PO) Take 1 tablet by mouth daily.   losartan-hydrochlorothiazide (HYZAAR) 100-25 MG tablet Take 1 tablet by mouth daily.   Multiple Vitamin (MULTIVITAMIN) tablet Take 1 tablet by mouth daily.   vitamin C (ASCORBIC ACID) 500 MG tablet Take 500 mg by mouth daily.   No facility-administered medications prior to visit.    ROS        Objective:     BP 122/65   Pulse 82   Ht 5\' 3"  (1.6 m)   Wt 199 lb (90.3 kg)   LMP 10/16/2010   SpO2 96%   BMI 35.25 kg/m    Physical Exam   No results found for any visits on 09/10/22.     Assessment & Plan:    Routine Health Maintenance  and Physical Exam  Immunization History  Administered Date(s) Administered   Fluad Quad(high Dose 65+) 03/03/2021, 03/12/2022   Hepatitis B 01/12/2019, 02/12/2019   Hepatitis B, ADULT 07/23/2019   Influenza Split 02/14/2012, 02/21/2018   Influenza Whole 03/17/2006, 12/24/2010   Influenza, Quadrivalent, Recombinant, Inj, Pf 02/21/2018   Influenza,inj,Quad PF,6+ Mos 01/04/2014, 03/21/2015, 01/05/2016, 02/01/2017, 01/17/2020   Influenza-Unspecified 02/12/2019   PFIZER(Purple Top)SARS-COV-2 Vaccination 08/09/2019, 08/30/2019   PNEUMOCOCCAL CONJUGATE-20 11/11/2020   Td 05/10/2005   Tdap 09/30/2014   Zoster Recombinat (Shingrix) 06/01/2018, 07/31/2018   Zoster, Live 01/05/2016    Health Maintenance  Topic Date Due   COVID-19 Vaccine (3 - Pfizer risk series) 09/25/2022 (Originally 09/27/2019)   INFLUENZA VACCINE  12/09/2022   Medicare Annual Wellness (AWV)  09/01/2023   MAMMOGRAM  08/31/2024   DTaP/Tdap/Td (3 - Td or Tdap) 09/29/2024   COLONOSCOPY (Pts 45-10yrs Insurance coverage will need to be confirmed)  12/03/2026   Pneumonia Vaccine 20+ Years old  Completed   DEXA SCAN  Completed   Hepatitis C Screening  Completed   Zoster Vaccines- Shingrix  Completed   HPV VACCINES  Aged Out  Discussed health benefits of physical activity, and encouraged her to engage in regular exercise appropriate for her age and condition.  Problem List Items Addressed This Visit       Cardiovascular and Mediastinum   HYPERTENSION, BENIGN ESSENTIAL   Relevant Orders   Lipid Panel w/reflex Direct LDL   COMPLETE METABOLIC PANEL WITH GFR   CBC   Other Visit Diagnoses     Wellness examination    -  Primary   Hyperlipidemia, unspecified hyperlipidemia type       Relevant Orders   Lipid Panel w/reflex Direct LDL   COMPLETE METABOLIC PANEL WITH GFR   CBC     Keep up a regular exercise program and make sure you are eating a healthy diet Try to eat 4 servings of dairy a day, or if you are lactose  intolerant take a calcium with vitamin D daily.  Your vaccines are up to date.   Skin lesion on her right forearm-she will plan to come back this summer and we will do a shave biopsy.  She also has several seborrheic keratoses those can be frozen if she would desire.  No follow-ups on file.     Nani Gasser, MD

## 2022-12-25 ENCOUNTER — Other Ambulatory Visit: Payer: Self-pay | Admitting: Family Medicine

## 2022-12-25 DIAGNOSIS — I10 Essential (primary) hypertension: Secondary | ICD-10-CM

## 2023-03-22 ENCOUNTER — Other Ambulatory Visit: Payer: Self-pay | Admitting: Family Medicine

## 2023-03-22 DIAGNOSIS — I10 Essential (primary) hypertension: Secondary | ICD-10-CM

## 2023-06-17 ENCOUNTER — Other Ambulatory Visit: Payer: Self-pay | Admitting: Family Medicine

## 2023-06-17 DIAGNOSIS — I10 Essential (primary) hypertension: Secondary | ICD-10-CM

## 2023-09-13 ENCOUNTER — Other Ambulatory Visit: Payer: Self-pay | Admitting: Family Medicine

## 2023-09-13 DIAGNOSIS — I10 Essential (primary) hypertension: Secondary | ICD-10-CM

## 2023-12-09 ENCOUNTER — Other Ambulatory Visit: Payer: Self-pay | Admitting: Family Medicine

## 2023-12-09 DIAGNOSIS — I10 Essential (primary) hypertension: Secondary | ICD-10-CM

## 2024-03-05 ENCOUNTER — Other Ambulatory Visit: Payer: Self-pay | Admitting: Family Medicine

## 2024-03-05 DIAGNOSIS — I10 Essential (primary) hypertension: Secondary | ICD-10-CM

## 2024-06-05 ENCOUNTER — Other Ambulatory Visit: Payer: Self-pay | Admitting: Family Medicine

## 2024-06-05 DIAGNOSIS — I10 Essential (primary) hypertension: Secondary | ICD-10-CM

## 2024-06-05 NOTE — Telephone Encounter (Signed)
 Requesting rx rf of  Losartan - hydrochlorothiazide  100-25mg  Last written 03/07/2024 Last OV 09/10/2022 Upcoming appt = none   Can the needed visit be done today as virtual?
